# Patient Record
Sex: Female | Born: 1965
Health system: Southern US, Community
[De-identification: ages and names within clinical notes are randomized; demographics above are authoritative.]

## PROBLEM LIST (undated history)

## (undated) DIAGNOSIS — D219 Benign neoplasm of connective and other soft tissue, unspecified: Secondary | ICD-10-CM

## (undated) DIAGNOSIS — D649 Anemia, unspecified: Secondary | ICD-10-CM

## (undated) HISTORY — PX: OTHER SURGICAL HISTORY: SHX169

## (undated) HISTORY — PX: TUBAL LIGATION: SHX77

---

## 1998-02-02 ENCOUNTER — Other Ambulatory Visit: Admission: RE | Admit: 1998-02-02 | Discharge: 1998-02-02 | Payer: Self-pay | Admitting: Obstetrics & Gynecology

## 1998-02-02 ENCOUNTER — Other Ambulatory Visit: Admission: RE | Admit: 1998-02-02 | Discharge: 1998-02-02 | Payer: Self-pay | Admitting: Obstetrics and Gynecology

## 1998-03-14 ENCOUNTER — Encounter: Admission: RE | Admit: 1998-03-14 | Discharge: 1998-03-14 | Payer: Self-pay | Admitting: Infectious Diseases

## 1999-04-29 ENCOUNTER — Other Ambulatory Visit: Admission: RE | Admit: 1999-04-29 | Discharge: 1999-04-29 | Payer: Self-pay | Admitting: Obstetrics and Gynecology

## 1999-05-31 ENCOUNTER — Other Ambulatory Visit: Admission: RE | Admit: 1999-05-31 | Discharge: 1999-05-31 | Payer: Self-pay | Admitting: Obstetrics and Gynecology

## 1999-05-31 ENCOUNTER — Encounter (INDEPENDENT_AMBULATORY_CARE_PROVIDER_SITE_OTHER): Payer: Self-pay | Admitting: Specialist

## 2000-01-20 ENCOUNTER — Other Ambulatory Visit: Admission: RE | Admit: 2000-01-20 | Discharge: 2000-01-20 | Payer: Self-pay | Admitting: Obstetrics and Gynecology

## 2000-04-12 ENCOUNTER — Emergency Department (HOSPITAL_COMMUNITY): Admission: EM | Admit: 2000-04-12 | Discharge: 2000-04-12 | Payer: Self-pay | Admitting: Emergency Medicine

## 2000-04-13 ENCOUNTER — Emergency Department (HOSPITAL_COMMUNITY): Admission: EM | Admit: 2000-04-13 | Discharge: 2000-04-13 | Payer: Self-pay | Admitting: Emergency Medicine

## 2000-06-03 ENCOUNTER — Other Ambulatory Visit: Admission: RE | Admit: 2000-06-03 | Discharge: 2000-06-03 | Payer: Self-pay | Admitting: Obstetrics and Gynecology

## 2002-03-09 ENCOUNTER — Other Ambulatory Visit: Admission: RE | Admit: 2002-03-09 | Discharge: 2002-03-09 | Payer: Self-pay | Admitting: Obstetrics and Gynecology

## 2002-10-27 HISTORY — PX: MYOMECTOMY: SHX85

## 2003-04-28 ENCOUNTER — Other Ambulatory Visit: Admission: RE | Admit: 2003-04-28 | Discharge: 2003-04-28 | Payer: Self-pay | Admitting: Obstetrics and Gynecology

## 2003-09-13 ENCOUNTER — Inpatient Hospital Stay (HOSPITAL_COMMUNITY): Admission: RE | Admit: 2003-09-13 | Discharge: 2003-09-16 | Payer: Self-pay | Admitting: Obstetrics and Gynecology

## 2003-09-13 ENCOUNTER — Encounter (INDEPENDENT_AMBULATORY_CARE_PROVIDER_SITE_OTHER): Payer: Self-pay

## 2004-05-06 ENCOUNTER — Other Ambulatory Visit: Admission: RE | Admit: 2004-05-06 | Discharge: 2004-05-06 | Payer: Self-pay | Admitting: Family Medicine

## 2005-07-14 ENCOUNTER — Emergency Department (HOSPITAL_COMMUNITY): Admission: EM | Admit: 2005-07-14 | Discharge: 2005-07-14 | Payer: Self-pay | Admitting: Emergency Medicine

## 2005-08-05 ENCOUNTER — Other Ambulatory Visit: Admission: RE | Admit: 2005-08-05 | Discharge: 2005-08-05 | Payer: Self-pay | Admitting: Family Medicine

## 2005-12-17 ENCOUNTER — Other Ambulatory Visit: Admission: RE | Admit: 2005-12-17 | Discharge: 2005-12-17 | Payer: Self-pay | Admitting: Family Medicine

## 2010-04-26 ENCOUNTER — Other Ambulatory Visit: Admission: RE | Admit: 2010-04-26 | Discharge: 2010-04-26 | Payer: Self-pay | Admitting: Family Medicine

## 2010-08-13 ENCOUNTER — Encounter: Admission: RE | Admit: 2010-08-13 | Discharge: 2010-08-13 | Payer: Self-pay | Admitting: Family Medicine

## 2011-03-14 NOTE — H&P (Signed)
NAME:  Rebecca Lindsey, Rebecca Lindsey                         ACCOUNT NO.:  [id number]   MEDICAL RECORD NO.:  [id number]                   PATIENT TYPE:  INP   LOCATION:  NA                                   FACILITY:  [facility]   PHYSICIAN:  Maxie Better, M.D.            DATE OF BIRTH:  1966-07-29   DATE OF ADMISSION:  DATE OF DISCHARGE:                                HISTORY & PHYSICAL   ANTICIPATED DATE OF SURGERY:  September 13, 2003.   CHIEF COMPLAINT:  Heavy menses and uterine fibroids.   HISTORY OF PRESENT ILLNESS:  [age]-year-old gravida 4, para 2, 1,  1, 4 black female with a cesarean section times two with symptomatic uterine  fibroids admitted for myomectomy.  The patient has had heavy menses for the  first to days needing to change super plus tampons and pads every 1.5-2  hours, which are usually soaked.  She also has associated clots.  Her last a  little bit under seven days.  The patient has known history of uterine  fibroids.  She has a known history of blood transfusion in 1986 and she has  had a tubal ligation.  Her evaluation included an ultrasound on May 16, 2003 showing multiple uterine fibroids.  The uterus measured 11.7 x 9.4 x  8.5 cm with a 3.1 cm endometrial mass suggestive of a submucosal fibroids.  Both ovaries were normal at that time.   The patient underwent counseling regarding her options and in spite of her  tubal ligation she desires to preserve her uterus.   A CBC drawn on May 16, 2003 showed a hemoglobin of 10.2 and the patient was  started on iron supplementation.   ALLERGIES:  No known drug allergies.   MEDICATIONS:  Niferex.   PAST MEDICAL HISTORY:  1. Irritable bowel syndrome.  2. Lymphocytic T4 on blood count.  3. Status post history of blood transfusion in 1986.   PAST GYNECOLOGICAL HISTORY:  1. CIN-28 May 1999.  2. Uterus fibroids.   PAST SURGICAL HISTORY:  1. Cesarean section times two.  2. Tubal ligation.  3. D&C.   PAST  OBSTETRICAL HISTORY:  1. D&E times one.  2. Two term deliveries.  3. One preterm birth of twins.   FAMILY HISTORY:  Family history is noncontributory.   SOCIAL HISTORY:  Four children.  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   REVIEW OF SYSTEMS:  Review of systems negative, except for notice of the GI  system; history of irritable bowel.  GYN history as per the history of  present illness.  Gingival bleeding. All other systems are negative.   PHYSICAL EXAMINATION:  GENERAL APPEARANCE:  Well-developed, well-nourished  black female in no acute distress.  VITAL SIGNS:  Blood pressure 100/62.  Weight 146 pounds.  Height is 5 feet 4  inches.  SKIN:  Skin shows no lesions.  HEENT EXAMINATION:  Anicteric sclerae.  Pale  pink conjunctivae.  Oropharynx  negative.  HEART:  Heart is regular rate and rhythm without murmur.  LUNGS:  Lungs are clear to auscultation.  BREASTS:  Breasts are soft, nontender and not palpable mass.  LYMPHATICS:  Nodes have no palpable axillary, supraclavicular or inguinal  nodes.  BACK:  No CVA tenderness.  No paraspinal tenderness.  ABDOMEN:  Abdomen soft.  Vertical scar.  Palpable suprapubic mass.  No  organomegaly of the liver or spleen.  PELVIC EXAMINATION:  Vulva shows no lesion.  Vagina has no discharge.  Cervix is clear.  Uterus is irregular, anteverted, 10 weeks size.  Adnexa;  no palpable mass and nontender.   IMPRESSION:  Symptomatic uterine fibroids.   PLAN:  1. Admission.  2. Myomectomy.  3. Antibiotic prophylaxis.  4. Antiembolic stockings.   The risks of the procedure including, but not limited to infection,  bleeding, up to a 30% chance of needing surgery in the future such as a  myomectomy or a hysterectomy, injury to surrounding organs or structures  such as the bladder, bowel or ureters, internal scar tissue, infertility  secondary to scar related to the myomectomy, about a 2% risk of a  hysterectomy, and discussed if fibroid  in location which increases he risks  of a hysterectomy the fibroid will be left alone.  The risks of blood  transfusion were discussed including, but not limited to HIV transmission,  hepatitis transmission and acute reaction.  Postop care and criteria for  discharge were reviewed.  Routine admission labs planned and ordered  antiembolic stockings.  Continuation of iron supplementation  postoperatively.                                               Maxie Better, M.D.    [place]/MEDQ  D:  09/13/2003  T:  09/13/2003  Job:  [id number]

## 2011-03-14 NOTE — Op Note (Signed)
NAME:  Rebecca Lindsey, Rebecca Lindsey                         ACCOUNT NO.:  [id number]   MEDICAL RECORD NO.:  [id number]                   PATIENT TYPE:  INP   LOCATION:  9399                                 FACILITY:  [facility]   PHYSICIAN:  Maxie Better, M.D.            DATE OF BIRTH:  1966/09/04   DATE OF PROCEDURE:  09/13/2003  DATE OF DISCHARGE:                                 OPERATIVE REPORT   PREOPERATIVE DIAGNOSIS:  Symptomatic uterine fibroids.   PROCEDURES:  1. Exploratory laparotomy.  2. Multiple myomectomy.   POSTOPERATIVE DIAGNOSES:  1. Intramural, intracavitary, subserosal pedunculated fibroids.  2. Menorrhagia.   ANESTHESIA:  General.   SURGEON:  Maxie Better, M.D.   ASSISTANT:  Richardean Sale, M.D.   INDICATIONS:  This is a 45 year old gravida 4, para 2-1-1-4, female, with a  history of a cesarean section and tubal ligation, who has had symptomatic  uterine fibroids in the form of menorrhagia with associated anemia, who now  presents for surgical management.  The patient has a planned hypertension.  The risks and benefits of the procedure have been explained to the patient.  Consent was signed.  The patient was transferred to the operating room.   DESCRIPTION OF PROCEDURE:  Under adequate general anesthesia, the patient  was placed in the supine position.  Examination under anesthesia revealed a  10 week-size irregular, mobile uterus.  Antibiotic prophylaxis was started,  as were intermittent compression stockings.  The patient was sterilely  prepped and draped in the usual fashion.  A pediatric Foley was utilized for  indwelling Foley catheter.  Marcaine 0.25% 8 mL was injected along the  previous vertical skin incision.  A vertical skin incision was then made,  carried down to the rectus fascia using Bovie cautery.  The rectus fascia  was incised and extended superiorly and inferiorly.  The rectus muscle was  then split in the midline and the parietal peritoneum  was entered bluntly.  On entering, the upper abdomen was explored.  Normal palpable liver edge and  kidneys.  Attention was then turned to the uterus, which was about 10 weeks'  size with multiple fibroids, some of which were pedunculated in the anterior  fundal aspect as well as posteriorly.  Prominently was a left low uterine  segment up to the left round ligament of a 5 cm lateral fibroid, a 3 cm  right lower uterine segment lateral fibroid, and another larger right fundal  posterior fibroid just below the right fallopian tube.  Both tubes were  notable for previous surgical separation.  The proximal portion of the left  fallopian tube was 1 cm.  The proximal portion of the right fallopian tube  was 2 cm in length.  The distal portion of the tube otherwise looked normal.  Both ovaries were normal.  There were several intramural fibroids on  palpation.  The bowels were packed upwardly and a self-retaining retractor  was then  utilized and using one of the pedunculated fibroids, a single-tooth  tenaculum was used to place traction on the uterus.  A dilute solution of  Pitressin was injected anteriorly overlying the fibroid.  A vertical  incision was then made. The fibroid was enucleated through the base.  Two  other additional fibroids were removed and on removal of those additional  fibroids, the endometrial cavity was entered.  The opportunity was then  taken to explore the cavity with a digital finger, which confirmed the  finding of a left intracavitary fibroid, which was then removed.  The  incision was then closed with a deep layer of 0 Vicryl running, interrupted  figure-of-eight sutures, and its surface was approximated using 2-0 Monocryl  suture.  Attention was then turned to the right posterior fundal fibroid and  Pitressin was then injected.  A transverse incision was then made to avoid  adhesions that potentially could occur in that area for the tube and the  fibroid was  enucleated from its base.  Additional fibroid was noted;  however, it was unclear as to whether or not this was early fibroid  formation versus adenomyosis, and a portion of that was removed and sent for  evaluation.  The defect was then explored further.  Additional fibroids were  removed.  The cavity was then again closed in this usual fashion with 0  Vicryl figure-of-eight sutures and the serosal surface was closed with 2-0  Vicryl baseball stitch.  The right lower uterine segment fibroid after much  examination was felt to be manageable to be removed, at which time Pitressin  was injected, an incision was then made medially, and the fibroid was then  removed, the base closed with 0 Vicryl in continuous fashion.  Good  hemostasis was noted.  Considerable contemplation regarding removal of the  left fibroid was done and after its visualization posteriorly and anteriorly  and palpation a decision was then made to remove the fibroid anteriorly, and  this was performed with a transverse incision made and the fibroid removed  with traction toward the midline, taking care not to disturb the lateral  vessels.  This was accomplished.  The lateral aspect of the broad ligament  was inspected, no bleeders were noted, and careful closure of the defect was  then accomplished using 0 Vicryl figure-of-eight sutures up to the level of  the serosal surface.  At that point good hemostasis was noted and  subsequently there was increased bleeding noted that was consistent with  venous bleeding.  The venous area was gently clamped and a 3-0 Vicryl suture  was then used to base the vessel towards the myometrium, and this resulted  in hemostasis noted.  Posteriorly the broad ligament was inspected for any  evidence of hematoma.  This was not seen.  The bladder peritoneum that was  noted anteriorly and to be adherent was partially lysed in order to facilitate the removal of these fibroids.  The pedunculated and  remaining  fibroids were then removed in a similar fashion, exposed with either 0  Vicryl figure-of-eight and 2-0 Monocryl or just with cauterization due to  their small size.  With good hemostasis noted after monitoring the incision  sites, the abdomen was irrigated, suctioned of debris.  There was bleeding  noted in the first incision vertically of the uterus, and his was reinforced  using 2-0 Monocryl sutures interrupted.  Interceed was then placed over  anterior and posterior incisions.  The upper abdomen packings were  then  removed.  The self-retaining retractor and B[place]our was removed.  The omental  adhesion that was noted to the left anterior wall was lysed.  The appendix  was not seen.  The parietal peritoneum was not closed.  The rectus fascia  was closed with 0 Vicryl x2 and the skin was approximated using Ethicon  staples after irrigation and suction and small bleeders cauterized.   SPECIMENS:  Myoma x11 and tissue for evaluation of adenomyosis.   ESTIMATED BLOOD LOSS:  400 mL.   FLUIDS REPLACED:  1800 mL crystalloid.   URINE OUTPUT:  160 mL clear yellow urine.   COUNTS:  Sponge and instrument counts were correct.   COMPLICATIONS:  None.  The endometrial cavity was entered.   DISPOSITION:  The patient tolerated the procedure well, was transferred to  the recovery room in stable condition.                                               Maxie Better, M.D.    [place]/MEDQ  D:  09/13/2003  T:  09/14/2003  Job:  [id number]

## 2011-03-14 NOTE — Discharge Summary (Signed)
NAME:  Rebecca Lindsey, Rebecca Lindsey                         ACCOUNT NO.:  [id number]   MEDICAL RECORD NO.:  [id number]                   PATIENT TYPE:  INP   LOCATION:  9309                                 FACILITY:  [facility]   PHYSICIAN:  Maxie Better, M.D.            DATE OF BIRTH:  January 26, 1966   DATE OF ADMISSION:  09/13/2003  DATE OF DISCHARGE:  09/16/2003                                 DISCHARGE SUMMARY   ADMISSION DIAGNOSIS:  Symptomatic uterine fibroids.   DISCHARGE DIAGNOSES:  1. Adenomyosis.  2. Leiomyomata.  3. Endometrial polyp.   PROCEDURE:  1. Exploratory laparotomy.  2. Multiple myomectomy.   HISTORY OF PRESENT ILLNESS:  A 45 year old gravida 4, para 2-1-1-4 black  female with cesarean section x2 with symptomatic uterine fibroids admitted  for myomectomy.  Please see the dictated H&P for specific details.   HOSPITAL COURSE:  The patient was admitted to [facility]  She was  taken to the operating room where she underwent exploratory laparotomy with  multiple myomectomy.  Findings at the time of surgery was a 10-week sized  irregular uterus, bladder/peritoneum adhesions.  Endometrial cavity was  entered.  Intracavitary 2.5 cm lesion was removed.  Additional fibroids were  removed as noted in operative report.  The appendix was not seen.  She had a  normal upper abdomen.  The omentum had some adhesions to the left anterior  wall, which were lysed.  The postoperative course was complicated by slow  bowel function.  Her CBC on postoperative day #1 showed a hemoglobin 9.8,  hematocrit 29.1, white count 12.3.  The patient was placed on a regular  diet, however, was slow to pass flatus.  She developed what appeared to be  probably a postoperative ileus.  She was tried with warm heat to the  abdomen, repeat Dulcolax suppositories and subsequently on postoperative day  #3 the patient was passing flatus, had some nausea with her pain medicine,  but was complaining of hunger.   Tolerated a regular diet and was ready to go  home.  Temperature maximum was 100.4.  She subsequently defervesced after 24  hours.  Her incision, which was vertical, had staples.  No erythema,  induration, or exudate.  Had no blood.  Abdomen with active bowel sounds,  soft, with markedly decreased distention.  She was deemed well to be  discharged home.   DISPOSITION:  Home.   CONDITION ON DISCHARGE:  Stable.   DISCHARGE INSTRUCTIONS:  Call for temperature greater than or equal to  100.4.  Nothing per vagina for four to six weeks.  No heavy lifting or  driving for two weeks.  Call for severe abdominal pain, nausea, vomiting,  soaking a regular pad every hour or more frequently.   DISCHARGE MEDICATIONS:  1. Zofran ODT.  2. Motrin 800 mg one p.o. q.6-8h. p.r.n. pain.  3. Tylenol No.3 one to two tablets q.3-4h. p.r.n. pain.   DISCHARGE FOLLOWUP:  Staple removal on November 24.  Follow up at four to  six weeks thereafter.   FINAL PATHOLOGY:  Benign endometrial polyp, 11 benign leiomyomata, and  adenomyosis.                                               Maxie Better, M.D.    [place]/MEDQ  D:  10/19/2003  T:  10/19/2003  Job:  [id number]

## 2011-07-14 ENCOUNTER — Other Ambulatory Visit: Payer: Self-pay | Admitting: Family Medicine

## 2011-07-14 DIAGNOSIS — Z1231 Encounter for screening mammogram for malignant neoplasm of breast: Secondary | ICD-10-CM

## 2012-09-06 ENCOUNTER — Other Ambulatory Visit: Payer: Self-pay | Admitting: Family Medicine

## 2012-09-06 DIAGNOSIS — Z1231 Encounter for screening mammogram for malignant neoplasm of breast: Secondary | ICD-10-CM

## 2012-10-14 ENCOUNTER — Ambulatory Visit
Admission: RE | Admit: 2012-10-14 | Discharge: 2012-10-14 | Disposition: A | Discharge status: Home / Self Care | Payer: 59 | Source: Ambulatory Visit | Attending: Family Medicine | Admitting: Family Medicine

## 2012-10-14 DIAGNOSIS — Z1231 Encounter for screening mammogram for malignant neoplasm of breast: Secondary | ICD-10-CM

## 2013-12-20 ENCOUNTER — Other Ambulatory Visit: Payer: Self-pay

## 2013-12-20 DIAGNOSIS — Z1231 Encounter for screening mammogram for malignant neoplasm of breast: Secondary | ICD-10-CM

## 2014-01-02 ENCOUNTER — Ambulatory Visit
Admission: RE | Admit: 2014-01-02 | Discharge: 2014-01-02 | Disposition: A | Discharge status: Home / Self Care | Payer: 59 | Source: Ambulatory Visit

## 2014-01-02 DIAGNOSIS — Z1231 Encounter for screening mammogram for malignant neoplasm of breast: Secondary | ICD-10-CM

## 2015-05-14 ENCOUNTER — Other Ambulatory Visit: Payer: Self-pay | Admitting: Family Medicine

## 2015-05-17 LAB — CYTOLOGY - PAP

## 2015-07-13 ENCOUNTER — Other Ambulatory Visit: Payer: Self-pay | Admitting: Obstetrics and Gynecology

## 2015-07-13 DIAGNOSIS — D259 Leiomyoma of uterus, unspecified: Secondary | ICD-10-CM

## 2015-07-13 DIAGNOSIS — N921 Excessive and frequent menstruation with irregular cycle: Secondary | ICD-10-CM

## 2015-07-26 ENCOUNTER — Ambulatory Visit
Admission: RE | Admit: 2015-07-26 | Discharge: 2015-07-26 | Disposition: A | Discharge status: Home / Self Care | Payer: 59 | Source: Ambulatory Visit | Attending: Obstetrics and Gynecology | Admitting: Obstetrics and Gynecology

## 2015-07-26 ENCOUNTER — Other Ambulatory Visit: Payer: Self-pay | Admitting: Obstetrics and Gynecology

## 2015-07-26 ENCOUNTER — Encounter (INDEPENDENT_AMBULATORY_CARE_PROVIDER_SITE_OTHER): Payer: Self-pay

## 2015-07-26 DIAGNOSIS — N921 Excessive and frequent menstruation with irregular cycle: Secondary | ICD-10-CM | POA: Insufficient documentation

## 2015-07-26 DIAGNOSIS — D259 Leiomyoma of uterus, unspecified: Secondary | ICD-10-CM | POA: Insufficient documentation

## 2015-07-26 HISTORY — DX: Benign neoplasm of connective and other soft tissue, unspecified: D21.9

## 2015-07-26 HISTORY — DX: Anemia, unspecified: D64.9

## 2015-07-26 NOTE — Consult Note (Signed)
Chief Complaint: Symptomatic Uterine Fibroids.   Referring Physician(s): Dr. Servando Salina  History of Present Illness: Rebecca Lindsey is a 49 y.o. female presenting today for evaluation of symptomatic uterine fibroids, kindly referred by Dr. Garwin Brothers.    Rebecca Lindsey reports that she has symptoms in all 3 classes of symptoms attributable to uterine fibroids, including heavy bleeding, pain, and bulk-type symptoms. She reports that although she had a 2 or 3 month. At the beginning of 2016, she has had regularly heavy bleeding during her cycles since May, including some of her periods as long as 14 days, so with a range from 5 days to 14 days. She typically has at least 3-4 days of heavy bleeding with frequent tampon and pad changes, with large clots. In addition she describes a cramping pain within her low pelvis which is uncomfortable, as well as urinary frequency and urgency and incomplete emptying of her bladder.  In fact, she has had a prior myomectomy she reports in 2004 to treat her fibroids, now with recurrence of symptoms.  She has no intention for further pregnancy.  I had a lengthy discussion regarding the pathology, anatomy, pathophysiology, as well as treatment options for uterine fibroids. Treatment options which I discussed included those with which she is familiar including myomectomy, hysterectomy, as well as high intensity focused ultrasound, also with which she was familiar. In addition I discussed an option of medical therapy, which she has already discussed with her OB/GYN.  I had a lengthy discussion regarding the endovascular option of uterine fibroid embolization, including risks and benefits. Specific risks include infection, arterial injury, contrast reaction, kidney injury, failure of treatment, need for further surgery/procedure, need for hysterectomy, endometritis/abscess, cardiopulmonary collapse, death. I also discussed with her the efficacy of the procedure  with our knowledge over the past 20 years of at least 92% to 95% efficacy of the procedure at 12 months.  I offered her a superior hypogastric nerve block as an adjunctive pain treatment for the procedure.      Past Medical History  Diagnosis Date  . Fibroid   . Anemia     Past Surgical History  Procedure Laterality Date  . Cesarean section    . Tubal ligation    . Myomectomy  2004    Allergies: Review of patient's allergies indicates no known allergies.  Medications: Prior to Admission medications   Medication Sig Start Date End Date Taking? Authorizing Provider  iron polysaccharides (NU-IRON) 150 MG capsule Take 150 mg by mouth 2 (two) times daily.   Yes Historical Provider, MD     No family history on file.  Social History   Social History  . Marital Status: Single    Spouse Name: N/A  . Number of Children: N/A  . Years of Education: N/A   Social History Main Topics  . Smoking status: Never Smoker   . Smokeless tobacco: Never Used  . Alcohol Use: 0.0 oz/week    0 Standard drinks or equivalent per week     Comment: occasional glass of wine    . Drug Use: No  . Sexual Activity: Not on file   Other Topics Concern  . Not on file   Social History Narrative  . No narrative on file     Review of Systems: A 12 point ROS discussed and pertinent positives are indicated in the HPI above.  All other systems are negative.  Review of Systems  Vital Signs: BP 108/69 mmHg  Pulse 66  Temp(Src) 99 F (37.2 C) (Oral)  Resp 14  Ht 5\' 4"  (1.626 m)  Wt 163 lb (73.936 kg)  BMI 27.97 kg/m2  SpO2 100%  LMP 06/26/2015 (Approximate)  Physical Exam Atraumatic normocephalic, mucous membranes moist pink. No icterus or scleral injection. Conjugate gaze. Neck soft supple no adenopathy. Cervical range of motion intact. Moving all 4 extremities equally and grossly. No gross motor or sensory deficits. Answering questions appropriately with normal affect. Alerted to  person place and time. Clear to auscultation bilaterally. No wheezes rhonchi or rales. Regular rate and rhythm. No third heart sound appreciated. Abdomen soft nontender. Negative Murphy sign. No enlargement of the liver or spleen is appreciated. Palpable distal pulses. Palpable common femoral pulse on the right. No lower extremity swelling.    Mallampati Score: 2    Imaging: No results found.  Labs:  CBC: No results for input(s): WBC, HGB, HCT, PLT in the last 8760 hours.  COAGS: No results for input(s): INR, APTT in the last 8760 hours.  BMP: No results for input(s): NA, K, CL, CO2, GLUCOSE, BUN, CALCIUM, CREATININE, GFRNONAA, GFRAA in the last 8760 hours.  Invalid input(s): CMP  LIVER FUNCTION TESTS: No results for input(s): BILITOT, AST, ALT, ALKPHOS, PROT, ALBUMIN in the last 8760 hours.  TUMOR MARKERS: No results for input(s): AFPTM, CEA, CA199, CHROMGRNA in the last 8760 hours.  Assessment and Plan:  Mrs. Ace Lindsey is a 49 year old female presenting for evaluation for uterine artery embolization for symptomatic uterine fibroids, of which she is demonstrating all 3 class symptoms of her fibroids.  I believe she is a candidate for uterine fibroid embolization, and after our discussion, she wishes to proceed. At this time she is deferring a superior hypogastric nerve block.  We will order an MRI for evaluation, with a plan on future treatment.  Thank you for this interesting consult.  I greatly enjoyed meeting [name] and look forward to participating in their care.  A copy of this report was sent to the requesting provider on this date.  SignedCorrie Mckusick 07/26/2015, 2:31 PM   I spent a total of  40 Minutes   in face to face in clinical consultation, greater than 50% of which was counseling/coordinating care for symptomatic uterine fibroids, and UFE.

## 2015-08-09 ENCOUNTER — Ambulatory Visit
Admission: RE | Admit: 2015-08-09 | Discharge: 2015-08-09 | Disposition: A | Discharge status: Home / Self Care | Payer: 59 | Source: Ambulatory Visit | Attending: Obstetrics and Gynecology | Admitting: Obstetrics and Gynecology

## 2015-08-09 DIAGNOSIS — N921 Excessive and frequent menstruation with irregular cycle: Secondary | ICD-10-CM

## 2015-08-09 DIAGNOSIS — D259 Leiomyoma of uterus, unspecified: Secondary | ICD-10-CM

## 2015-08-09 MED ORDER — GADOBENATE DIMEGLUMINE 529 MG/ML IV SOLN: 14.0000 mL | Freq: Once | INTRAVENOUS | Status: DC | PRN

## 2015-08-14 ENCOUNTER — Other Ambulatory Visit: Payer: Self-pay | Admitting: Interventional Radiology

## 2015-08-14 DIAGNOSIS — D251 Intramural leiomyoma of uterus: Secondary | ICD-10-CM

## 2015-08-17 ENCOUNTER — Other Ambulatory Visit: Payer: Self-pay | Admitting: Radiology

## 2015-08-21 ENCOUNTER — Encounter (HOSPITAL_COMMUNITY): Payer: Self-pay

## 2015-08-21 ENCOUNTER — Ambulatory Visit (HOSPITAL_COMMUNITY)
Admission: RE | Admit: 2015-08-21 | Discharge: 2015-08-21 | Disposition: A | Discharge status: Home / Self Care | Payer: 59 | Source: Ambulatory Visit | Attending: Interventional Radiology | Admitting: Interventional Radiology

## 2015-08-21 ENCOUNTER — Other Ambulatory Visit: Payer: Self-pay | Admitting: Interventional Radiology

## 2015-08-21 ENCOUNTER — Observation Stay (HOSPITAL_COMMUNITY)
Admission: RE | Admit: 2015-08-21 | Discharge: 2015-08-22 | Disposition: A | Discharge status: Home / Self Care | Payer: 59 | Source: Ambulatory Visit | Attending: Interventional Radiology | Admitting: Interventional Radiology

## 2015-08-21 DIAGNOSIS — Z79899 Other long term (current) drug therapy: Secondary | ICD-10-CM | POA: Diagnosis not present

## 2015-08-21 DIAGNOSIS — D649 Anemia, unspecified: Secondary | ICD-10-CM | POA: Diagnosis not present

## 2015-08-21 DIAGNOSIS — D251 Intramural leiomyoma of uterus: Principal | ICD-10-CM | POA: Diagnosis present

## 2015-08-21 LAB — BASIC METABOLIC PANEL
Anion gap: 5 (ref 5–15)
BUN: 12 mg/dL (ref 6–20)
CALCIUM: 9.1 mg/dL (ref 8.9–10.3)
CO2: 25 mmol/L (ref 22–32)
CREATININE: 0.75 mg/dL (ref 0.44–1.00)
Chloride: 109 mmol/L (ref 101–111)
GFR calc Af Amer: >60 mL/min (ref >60)
Glucose, Bld: 92 mg/dL (ref 65–99)
POTASSIUM: 3.9 mmol/L (ref 3.5–5.1)
SODIUM: 139 mmol/L (ref 135–145)

## 2015-08-21 LAB — CBC WITH DIFFERENTIAL/PLATELET
BASOS ABS: 0 10*3/uL (ref 0.0–0.1)
BASOS PCT: 1 %
EOS ABS: 0.1 10*3/uL (ref 0.0–0.7)
EOS PCT: 2 %
HCT: 33 % — ABNORMAL LOW (ref 36.0–46.0)
Hemoglobin: 10.6 g/dL — ABNORMAL LOW (ref 12.0–15.0)
LYMPHS PCT: 44 %
Lymphs Abs: 1.7 10*3/uL (ref 0.7–4.0)
MCH: 26.7 pg (ref 26.0–34.0)
MCHC: 32.1 g/dL (ref 30.0–36.0)
MCV: 83.1 fL (ref 78.0–100.0)
Monocytes Absolute: 0.4 10*3/uL (ref 0.1–1.0)
Monocytes Relative: 11 %
Neutro Abs: 1.6 10*3/uL — ABNORMAL LOW (ref 1.7–7.7)
Neutrophils Relative %: 42 %
PLATELETS: 288 10*3/uL (ref 150–400)
RBC: 3.97 MIL/uL (ref 3.87–5.11)
RDW: 20.6 % — AB (ref 11.5–15.5)
WBC: 3.9 10*3/uL — AB (ref 4.0–10.5)

## 2015-08-21 LAB — HCG, SERUM, QUALITATIVE: Preg, Serum: NEGATIVE

## 2015-08-21 LAB — PROTIME-INR
INR: 0.99 (ref 0.00–1.49)
PROTHROMBIN TIME: 13.3 s (ref 11.6–15.2)

## 2015-08-21 LAB — APTT: APTT: 28 s (ref 24–37)

## 2015-08-21 MED ORDER — SODIUM CHLORIDE 0.9 % IJ SOLN: 3.0000 mL | Freq: Two times a day (BID) | INTRAMUSCULAR | Status: DC

## 2015-08-21 MED ORDER — DEXAMETHASONE SODIUM PHOSPHATE 4 MG/ML IJ SOLN
4.0000 mg | Freq: Once | INTRAMUSCULAR | Status: AC
  Administered 2015-08-21: 4 mg via INTRAVENOUS
  Filled 2015-08-21 (×4): qty 1

## 2015-08-21 MED ORDER — KETOROLAC TROMETHAMINE 30 MG/ML IJ SOLN
30.0000 mg | Freq: Four times a day (QID) | INTRAMUSCULAR | Status: DC
  Administered 2015-08-21 – 2015-08-22 (×2): 30 mg via INTRAVENOUS
  Filled 2015-08-21 (×7): qty 1

## 2015-08-21 MED ORDER — NALOXONE HCL 0.4 MG/ML IJ SOLN: 0.4000 mg | INTRAMUSCULAR | Status: DC | PRN

## 2015-08-21 MED ORDER — DIPHENHYDRAMINE HCL 50 MG/ML IJ SOLN: 12.5000 mg | Freq: Four times a day (QID) | INTRAMUSCULAR | Status: DC | PRN

## 2015-08-21 MED ORDER — MIDAZOLAM HCL 2 MG/2ML IJ SOLN
INTRAMUSCULAR | Status: AC
  Filled 2015-08-21: qty 6

## 2015-08-21 MED ORDER — HYDROMORPHONE 1 MG/ML IV SOLN
INTRAVENOUS | Status: DC
  Administered 2015-08-21: 16:00:00 via INTRAVENOUS

## 2015-08-21 MED ORDER — PROMETHAZINE HCL 25 MG RE SUPP
25.0000 mg | Freq: Three times a day (TID) | RECTAL | Status: DC | PRN
  Administered 2015-08-21: 25 mg via RECTAL
  Filled 2015-08-21: qty 1

## 2015-08-21 MED ORDER — PROCHLORPERAZINE EDISYLATE 5 MG/ML IJ SOLN: 10.0000 mg | Freq: Four times a day (QID) | INTRAMUSCULAR | Status: DC | PRN

## 2015-08-21 MED ORDER — ONDANSETRON HCL 4 MG/2ML IJ SOLN: 4.0000 mg | Freq: Four times a day (QID) | INTRAMUSCULAR | Status: DC | PRN

## 2015-08-21 MED ORDER — KETOROLAC TROMETHAMINE 30 MG/ML IJ SOLN: 30.0000 mg | Freq: Once | INTRAMUSCULAR | Status: DC

## 2015-08-21 MED ORDER — CEFAZOLIN SODIUM-DEXTROSE 2-3 GM-% IV SOLR
2.0000 g | INTRAVENOUS | Status: AC
  Administered 2015-08-21: 2 g via INTRAVENOUS

## 2015-08-21 MED ORDER — FENTANYL CITRATE (PF) 100 MCG/2ML IJ SOLN
INTRAMUSCULAR | Status: AC | PRN
  Administered 2015-08-21: 25 ug via INTRAVENOUS
  Administered 2015-08-21: 50 ug via INTRAVENOUS
  Administered 2015-08-21: 25 ug via INTRAVENOUS

## 2015-08-21 MED ORDER — MIDAZOLAM HCL 2 MG/2ML IJ SOLN
INTRAMUSCULAR | Status: AC | PRN
  Administered 2015-08-21: 0.5 mg via INTRAVENOUS
  Administered 2015-08-21: 1 mg via INTRAVENOUS
  Administered 2015-08-21 (×2): 0.5 mg via INTRAVENOUS

## 2015-08-21 MED ORDER — SODIUM CHLORIDE 0.9 % IV SOLN: 250.0000 mL | INTRAVENOUS | Status: DC | PRN

## 2015-08-21 MED ORDER — FENTANYL CITRATE (PF) 100 MCG/2ML IJ SOLN
INTRAMUSCULAR | Status: AC
  Filled 2015-08-21: qty 4

## 2015-08-21 MED ORDER — SODIUM CHLORIDE 0.9 % IV SOLN
Freq: Once | INTRAVENOUS | Status: AC
  Administered 2015-08-21: 10:00:00 via INTRAVENOUS

## 2015-08-21 MED ORDER — DOCUSATE SODIUM 100 MG PO CAPS
100.0000 mg | ORAL_CAPSULE | Freq: Two times a day (BID) | ORAL | Status: DC
Start: 2015-08-21 — End: 2015-08-22
  Administered 2015-08-22: 100 mg via ORAL
  Filled 2015-08-21 (×2): qty 1

## 2015-08-21 MED ORDER — PROMETHAZINE HCL 25 MG PO TABS: 25.0000 mg | ORAL_TABLET | Freq: Three times a day (TID) | ORAL | Status: DC | PRN

## 2015-08-21 MED ORDER — ONDANSETRON HCL 4 MG/2ML IJ SOLN
4.0000 mg | Freq: Four times a day (QID) | INTRAMUSCULAR | Status: DC
  Administered 2015-08-21 – 2015-08-22 (×3): 4 mg via INTRAVENOUS
  Filled 2015-08-21 (×2): qty 2

## 2015-08-21 MED ORDER — DIPHENHYDRAMINE HCL 12.5 MG/5ML PO ELIX: 12.5000 mg | ORAL_SOLUTION | Freq: Four times a day (QID) | ORAL | Status: DC | PRN

## 2015-08-21 MED ORDER — HYDROMORPHONE 1 MG/ML IV SOLN
INTRAVENOUS | Status: AC
  Filled 2015-08-21: qty 25

## 2015-08-21 MED ORDER — IOHEXOL 300 MG/ML  SOLN
180.0000 mL | Freq: Once | INTRAMUSCULAR | Status: DC | PRN
Start: 2015-08-21 — End: 2015-08-22
  Administered 2015-08-21: 180 mL via INTRA_ARTERIAL
  Filled 2015-08-21: qty 180

## 2015-08-21 MED ORDER — KETOROLAC TROMETHAMINE 60 MG/2ML IM SOLN
60.0000 mg | Freq: Once | INTRAMUSCULAR | Status: AC
  Administered 2015-08-21: 60 mg via INTRAMUSCULAR
  Filled 2015-08-21 (×2): qty 2

## 2015-08-21 MED ORDER — LIDOCAINE HCL 1 % IJ SOLN
INTRAMUSCULAR | Status: AC
  Filled 2015-08-21: qty 20

## 2015-08-21 MED ORDER — SODIUM CHLORIDE 0.9 % IJ SOLN
9.0000 mL | INTRAMUSCULAR | Status: DC | PRN
Start: 2015-08-21 — End: 2015-08-21

## 2015-08-21 MED ORDER — DIPHENHYDRAMINE HCL 12.5 MG/5ML PO ELIX
12.5000 mg | ORAL_SOLUTION | Freq: Four times a day (QID) | ORAL | Status: DC | PRN
  Filled 2015-08-21: qty 5

## 2015-08-21 MED ORDER — SODIUM CHLORIDE 0.9 % IJ SOLN: 3.0000 mL | INTRAMUSCULAR | Status: DC | PRN

## 2015-08-21 MED ORDER — SODIUM CHLORIDE 0.9 % IV SOLN
INTRAVENOUS | Status: AC
  Administered 2015-08-21 – 2015-08-22 (×2): via INTRAVENOUS

## 2015-08-21 MED ORDER — FENTANYL 40 MCG/ML IV SOLN
INTRAVENOUS | Status: DC
  Administered 2015-08-21: 19:00:00 via INTRAVENOUS
  Administered 2015-08-21: 45 ug via INTRAVENOUS
  Filled 2015-08-21: qty 25

## 2015-08-21 MED ORDER — ONDANSETRON HCL 4 MG/2ML IJ SOLN
4.0000 mg | Freq: Once | INTRAMUSCULAR | Status: AC
  Administered 2015-08-21: 4 mg via INTRAVENOUS
  Filled 2015-08-21: qty 2

## 2015-08-21 MED ORDER — CEFAZOLIN SODIUM-DEXTROSE 2-3 GM-% IV SOLR
INTRAVENOUS | Status: AC
  Filled 2015-08-21: qty 50

## 2015-08-21 MED ORDER — ONDANSETRON HCL 4 MG/2ML IJ SOLN
4.0000 mg | Freq: Four times a day (QID) | INTRAMUSCULAR | Status: DC | PRN
  Filled 2015-08-21: qty 2

## 2015-08-21 MED ORDER — SODIUM CHLORIDE 0.9 % IJ SOLN: 9.0000 mL | INTRAMUSCULAR | Status: DC | PRN

## 2015-08-21 NOTE — Plan of Care (Signed)
Problem: Phase I Progression Outcomes Goal: Voiding-avoid urinary catheter unless indicated Outcome: Not Applicable Date Met:  65/99/35 Foley cath to be DC'd at 6 am tomorrow

## 2015-08-21 NOTE — H&P (Signed)
Chief Complaint: Patient was seen in consultation today for symptomatic uterine fibroids at the request of Dr. Servando Salina  Referring Physician(s): Dr. Servando Salina  History of Present Illness: Rebecca Lindsey is a 49 y.o. female with history of symptomatic uterine fibroids who was seen in full IR consult on 07/26/15- see full consult. She was deemed a candidate for UFE and scheduled today to undergo the procedure. She denies any current abdominal/pelvic pain. She does admit to current vaginal spotting, denies any lightheadedness or dizziness. She denies any urinary symptoms, nausea or vomiting. She denies any chest pain, shortness of breath or palpitations. She denies any recent infections, fever or chills. The patient denies any history of sleep apnea or chronic oxygen use. She has previously tolerated sedation without complications.    Past Medical History  Diagnosis Date  . Fibroid   . Anemia     Past Surgical History  Procedure Laterality Date  . Cesarean section    . Tubal ligation    . Myomectomy  2004    Allergies: Review of patient's allergies indicates no known allergies.  Medications: Prior to Admission medications   Medication Sig Start Date End Date Taking? Authorizing Provider  iron polysaccharides (NU-IRON) 150 MG capsule Take 150 mg by mouth 2 (two) times daily.   Yes Historical Provider, MD  Multiple Vitamin (MULTIVITAMIN WITH MINERALS) TABS tablet Take 1 tablet by mouth daily.   Yes Historical Provider, MD     History reviewed. No pertinent family history.  Social History   Social History  . Marital Status: Single    Spouse Name: N/A  . Number of Children: N/A  . Years of Education: N/A   Social History Main Topics  . Smoking status: Never Smoker   . Smokeless tobacco: Never Used  . Alcohol Use: 0.0 oz/week    0 Standard drinks or equivalent per week     Comment: occasional glass of wine    . Drug Use: No  . Sexual Activity: Not  Asked   Other Topics Concern  . None   Social History Narrative   Review of Systems: A 12 point ROS discussed and pertinent positives are indicated in the HPI above.  All other systems are negative.  Review of Systems  Vital Signs: BP 113/61 mmHg  Pulse 65  Temp(Src) 98.1 F (36.7 C) (Oral)  Resp 16  Ht 5\' 4"  (1.626 m)  Wt 158 lb (71.668 kg)  BMI 27.11 kg/m2  SpO2 100%  LMP 07/23/2015  Physical Exam  Constitutional: She is oriented to person, place, and time. No distress.  HENT:  Head: Normocephalic and atraumatic.  Cardiovascular: Normal rate, regular rhythm and intact distal pulses.  Exam reveals no gallop and no friction rub.   No murmur heard. Pulmonary/Chest: Effort normal and breath sounds normal. No respiratory distress. She has no wheezes. She has no rales.  Abdominal: Soft. Bowel sounds are normal. She exhibits no distension.  Musculoskeletal: She exhibits no edema.  Neurological: She is alert and oriented to person, place, and time.  Skin: Skin is warm and dry. She is not diaphoretic.    Mallampati Score:  MD Evaluation Airway: WNL Heart: WNL Abdomen: WNL Chest/ Lungs: WNL ASA  Classification: 2 Mallampati/Airway Score: One  Imaging: Mr Pelvis W Wo Contrast  08/09/2015  CLINICAL DATA:  Patient with history of menorrhagia and irregular menstrual cycles. Multiple uterine fibroids. Preprocedural evaluation for UFE. EXAM: MRI PELVIS WITHOUT AND WITH CONTRAST TECHNIQUE: Multiplanar multisequence MR  imaging of the pelvis was performed both before and after administration of intravenous contrast. CONTRAST:  14 mL MultiHance COMPARISON:  None. FINDINGS: Uterus: Measures 9.3 x 9.6 x 12.8 cm. Estimated volume = 571 cc. There is thickening of the junctional zone demonstrated best on the sagittal T2 images measuring up to 24 mm. Fibroids: There are innumerable fibroids throughout uterus located predominantly within an intramural location with a few of the fibroids  having submucosal components. There is a dominant 6.6 x 6.2 x 5.1 cm fibroid within the left aspect of the uterine body (image 13; series 3). There is a 3.8 x 3.6 x 3.9 cm fibroid within the uterine fundus. There is a 3.2 x 3.2 x 3.2 cm fibroid within the right aspect of the uterine body. Intracavitary fibroids:  None. Pedunculated fibroids: None. Fibroid contrast enhancement: There is heterogeneous enhancement of the majority of these fibroids. No definite nonenhancing fibroids are demonstrated. Cervix:  Multiple nabothian cysts. Right ovary: Normal appearance. No adnexal mass identified. The right ovary measures 1.6 x 1.5 x 2.3 cm. Left ovary: Normal appearance. No adnexal mass identified. The left ovary measures 1.9 x 2.0 x 2.3 cm. It contains a small follicle. Free fluid:  None. Other: No other pelvic masses, lymphadenopathy, or inflammatory process identified. IMPRESSION: Enlarged fibroid uterus. These fibroids demonstrate enhancement on postcontrast imaging. Thickening of the junctional zone as can be seen with adenomyosis. Electronically Signed   By: Lovey Newcomer M.D.   On: 08/09/2015 13:32    Labs:  CBC:  Recent Labs  08/21/15 1010  WBC 3.9*  HGB 10.6*  HCT 33.0*  PLT 288    COAGS:  Recent Labs  08/21/15 1010  INR 0.99  APTT 28    BMP:  Recent Labs  08/21/15 1010  NA 139  K 3.9  CL 109  CO2 25  GLUCOSE 92  BUN 12  CALCIUM 9.1  CREATININE 0.75  GFRNONAA >60  GFRAA >60    Assessment and Plan: Symptomatic uterine fibroids Seen in full IR consult on 07/26/15 MRI Pelvis 08/09/15 reviewed, negative PAP 05/14/15, negative surgical pathology 2004, negative HCG serum  Scheduled today for image guided uterine fibroid embolization with sedation The patient has been NPO, no blood thinners taken, labs and vitals have been reviewed. Risks and Benefits discussed with the patient including, but not limited to bleeding, infection, vascular injury, contrast induced renal failure,  non-target embolization, and post embolization syndrome. All of the patient's questions were answered, patient is agreeable to proceed. Consent signed and in chart.   SignedHedy Jacob 08/21/2015, 12:02 PM

## 2015-08-21 NOTE — Procedures (Signed)
Interventional Radiology Procedure Note  Procedure:  Right CFA access, aortogram, bilateral uterine artery embolization, for symptomatic uterine fibroids.  Exoseal deployed for hemostasis   Findings: Bilateral contribution to uterus by uterine arteries, right >> left 5 beat stasis on left and right after embolization with 500-700 micrometer beads  Complications: None Recommendations:  - 24 hour obs - 4 hours right leg straight, then ambulate as tolerated - Advance diet as tolerated - scheduled toradol IV - dilaudid pca overnight, DC at 6am - May DC foley when ambulating, or at 6am - Steroid dose pak on DC in am.  - PRN zofran - Do not submerge for 7 days - Routine care   Signed,  Dulcy Fanny. Earleen Newport, DO

## 2015-08-21 NOTE — Sedation Documentation (Signed)
22fr sheath removed from right fem artery by Dr. Earleen Newport. Hemostasis achieved using exoseal closure device and manual pressure held by Melchor Amour, RTR for 5 minutes. RDP +3, RPT +2, groin level 0.

## 2015-08-22 ENCOUNTER — Other Ambulatory Visit: Payer: Self-pay | Admitting: Radiology

## 2015-08-22 ENCOUNTER — Encounter (HOSPITAL_COMMUNITY): Payer: Self-pay

## 2015-08-22 DIAGNOSIS — D251 Intramural leiomyoma of uterus: Secondary | ICD-10-CM

## 2015-08-22 MED ORDER — ACETAMINOPHEN 325 MG PO TABS
650.0000 mg | ORAL_TABLET | Freq: Four times a day (QID) | ORAL | Status: DC | PRN
  Administered 2015-08-22: 650 mg via ORAL
  Filled 2015-08-22: qty 2

## 2015-08-22 NOTE — Progress Notes (Signed)
Pt's vitals are WNL, tolerating diet and pain is under controlled. Discussed discharge instructions with patient. Patient had no questions nor concerns. Discharged to home.

## 2015-08-22 NOTE — Progress Notes (Signed)
Fentanyl PCA was d/c'd. Wasted 22 mLs in sink with Laurita Quint, RN

## 2015-08-22 NOTE — Discharge Instructions (Signed)
Uterine Artery Embolization for Fibroids, Care After Refer to this sheet in the next few weeks. These instructions provide you with information on caring for yourself after your procedure. Your health care provider may also give you more specific instructions. Your treatment has been planned according to current medical practices, but problems sometimes occur. Call your health care provider if you have any problems or questions after your procedure. WHAT TO EXPECT AFTER THE PROCEDURE After your procedure, it is typical to have cramping in the pelvis. You will be given pain medicine to control it. HOME CARE INSTRUCTIONS  Only take over-the-counter or prescription medicines for pain, discomfort, or fever as directed by your health care provider.  Do not take aspirin. It can cause bleeding.  Follow your health care provider's advice regarding medicines given to you, diet, activity, and when to begin sexual activity.  See your health care provider for follow-up care as directed. SEEK MEDICAL CARE IF:  You have a fever.  You have redness, swelling, and pain around your incision site.  You have pus draining from your incision.  You have a rash. SEEK IMMEDIATE MEDICAL CARE IF:  You have bleeding from your incision site.  You have difficulty breathing.  You have chest pain.  You have severe abdominal pain.  You have leg pain.  You become dizzy and faint.   This information is not intended to replace advice given to you by your health care provider. Make sure you discuss any questions you have with your health care provider.   Document Released: 08/03/2013 Document Reviewed: 08/03/2013 Elsevier Interactive Patient Education 2016 [place]. Uterine Artery Embolization for Fibroids Uterine artery embolization is a nonsurgical treatment to shrink fibroids. A thin plastic tube (catheter) is used to inject material that blocks off the blood supply to the fibroid, which causes the  fibroid to shrink. LET [facility] CARE PROVIDER KNOW ABOUT:  Any allergies you have.  All medicines you are taking, including vitamins, herbs, eye drops, creams, and over-the-counter medicines.  Previous problems you or members of your family have had with the use of anesthetics.  Any blood disorders you have.  Previous surgeries you have had.  Medical conditions you have. RISKS AND COMPLICATIONS  Injury to the uterus from decreased blood supply  Infection.  Blood infection (septicemia).  Lack of menstrual periods (amenorrhea).  Death of tissue cells (necrosis) around your bladder or vulva.  Development of a hole between organs or from an organ to the surface of your skin (fistula).  Blood clot in the legs (deep vein thrombosis) or lung (pulmonary embolus). BEFORE THE PROCEDURE  Ask your health care provider about changing or stopping your regular medicines.   Do not take aspirin or blood thinners (anticoagulants) for 1 week before the surgery or as directed by your health care provider.  Do not eat or drink anything for 8 hours before the surgery or as directed by your health care provider.   Empty your bladder before the procedure begins. PROCEDURE   An IV tube will be placed into one of your veins. This will be used to give you a sedative and pain medication (conscious sedation).  You will be given a medicine that numbs the area (local anesthetic).  A small cut will be made in your groin. A catheter is then inserted into the main artery of your leg.  The catheter will be guided through the artery to your uterus. A series of images will be taken while dye is injected  through the catheter in your groin. X-rays are taken at the same time. This is done to provide a road map of the blood supply to your uterus and fibroids.  Tiny plastic spheres, about the size of sand grains, will be injected through the catheter. Metal coils may be used to help block the artery. The  particles will lodge in tiny branches of the uterine artery that supplies blood to the fibroids.  The procedure is repeated on the artery that supplies the other side of the uterus.  The catheter is then removed and pressure is held to stop any bleeding. No stitches are needed.  A dressing is then placed over the cut (incision). AFTER THE PROCEDURE  You will be taken to a recovery area where your progress will be monitored until you are awake, stable, and taking fluids well. If there are no other problems, you will then be moved to a regular hospital room.  You will be observed overnight in the hospital.  You will have cramping that should be controlled with pain medication.   This information is not intended to replace advice given to you by your health care provider. Make sure you discuss any questions you have with your health care provider.   Document Released: 12/29/2005 Document Revised: 08/03/2013 Document Reviewed: 04/28/2013 Elsevier Interactive Patient Education 2016 [place]. Uterine Artery Embolization for Fibroids, Care After Refer to this sheet in the next few weeks. These instructions provide you with information on caring for yourself after your procedure. Your health care provider may also give you more specific instructions. Your treatment has been planned according to current medical practices, but problems sometimes occur. Call your health care provider if you have any problems or questions after your procedure. WHAT TO EXPECT AFTER THE PROCEDURE After your procedure, it is typical to have cramping in the pelvis. You will be given pain medicine to control it. HOME CARE INSTRUCTIONS  Only take over-the-counter or prescription medicines for pain, discomfort, or fever as directed by your health care provider.  Do not take aspirin. It can cause bleeding.  Follow your health care provider's advice regarding medicines given to you, diet, activity, and when to begin  sexual activity.  See your health care provider for follow-up care as directed. SEEK MEDICAL CARE IF:  You have a fever.  You have redness, swelling, and pain around your incision site.  You have pus draining from your incision.  You have a rash. SEEK IMMEDIATE MEDICAL CARE IF:  You have bleeding from your incision site.  You have difficulty breathing.  You have chest pain.  You have severe abdominal pain.  You have leg pain.  You become dizzy and faint.   This information is not intended to replace advice given to you by your health care provider. Make sure you discuss any questions you have with your health care provider.   Document Released: 08/03/2013 Document Reviewed: 08/03/2013 Elsevier Interactive Patient Education [employer].

## 2015-08-22 NOTE — Discharge Summary (Signed)
Patient ID: Rebecca Lindsey MRN: [id number] DOB/AGE: [birth year] 49 y.o.  Admit date: 08/21/2015 Discharge date: 08/22/2015  Admission Diagnoses: Symptomatic uterine fibroids  Discharge Diagnoses: Symptomatic uterine fibroids, status post bilateral uterine artery embolization on 08/21/15 Active Problems:   Fibroids, intramural  Past Medical History  Diagnosis Date  . Fibroid   . Anemia    Past Surgical History  Procedure Laterality Date  . Cesarean section    . Tubal ligation    . Myomectomy  2004  . +-        Discharged Condition: good  Hospital Course: Ms. Rebecca Lindsey  is a 49 year old black female, patient of Rebecca Lindsey, who was recently referred to the interventional radiology service for treatment options regarding symptomatic uterine fibroids. She was seen by Rebecca Lindsey and deemed an appropriate candidate for bilateral uterine artery embolization. On 08/21/15 the patient underwent successful bilateral uterine artery embolization via IV conscious sedation. The procedure was performed without immediate complications and the patient was admitted for overnight observation. She was initially placed on PCA Dilaudid pump and subsequently changed to fentanyl. She did experience some nausea and vomiting overnight which was treated with anti emetics. On the morning of discharge the patient was stable. Nausea and vomiting had resolved. She was able to void, ambulate and tolerate her diet without difficulty. She was seen by Rebecca Lindsey and deemed stable for discharge at this time. She was given prescriptions for ibuprofen 600 mg, #15, no refills, 1 tablet every 8 hours for the next 5 days, Colace 100 mg, #20, no refills, 1 tablet twice daily as needed for constipation, Medrol Dosepak 4 mg, unit of use 21, no refills, take as directed, Norco 5/325, #30, no refills, 1-2 tablets every 4-6 hours as needed for moderate to severe pain, Zofran 8 mg, #20, no refills, 1 tablet twice a day  as needed for nausea. The patient will be scheduled for follow-up with Rebecca Lindsey in the interventional radiology clinic in 2-4 weeks. She will continue her current home medications and continue care with Rebecca Lindsey as scheduled.  Consults: none  Significant Diagnostic Studies:  Results for orders placed or performed during the hospital encounter of 08/21/15  APTT  Result Value Ref Range   aPTT 28 24 - 37 seconds  Basic metabolic panel  Result Value Ref Range   Sodium 139 135 - 145 mmol/L   Potassium 3.9 3.5 - 5.1 mmol/L   Chloride 109 101 - 111 mmol/L   CO2 25 22 - 32 mmol/L   Glucose, Bld 92 65 - 99 mg/dL   BUN 12 6 - 20 mg/dL   Creatinine, Ser 0.75 0.44 - 1.00 mg/dL   Calcium 9.1 8.9 - 10.3 mg/dL   GFR calc non Af Amer >60 >60 mL/min   GFR calc Af Amer >60 >60 mL/min   Anion gap 5 5 - 15  CBC with Differential/Platelet  Result Value Ref Range   WBC 3.9 (L) 4.0 - 10.5 K/uL   RBC 3.97 3.87 - 5.11 MIL/uL   Hemoglobin 10.6 (L) 12.0 - 15.0 g/dL   HCT 33.0 (L) 36.0 - 46.0 %   MCV 83.1 78.0 - 100.0 fL   MCH 26.7 26.0 - 34.0 pg   MCHC 32.1 30.0 - 36.0 g/dL   RDW 20.6 (H) 11.5 - 15.5 %   Platelets 288 150 - 400 K/uL   Neutrophils Relative % 42 %   Neutro Abs 1.6 (L) 1.7 - 7.7 K/uL   Lymphocytes  Relative 44 %   Lymphs Abs 1.7 0.7 - 4.0 K/uL   Monocytes Relative 11 %   Monocytes Absolute 0.4 0.1 - 1.0 K/uL   Eosinophils Relative 2 %   Eosinophils Absolute 0.1 0.0 - 0.7 K/uL   Basophils Relative 1 %   Basophils Absolute 0.0 0.0 - 0.1 K/uL  hCG, serum, qualitative  Result Value Ref Range   Preg, Serum NEGATIVE NEGATIVE  Protime-INR  Result Value Ref Range   Prothrombin Time 13.3 11.6 - 15.2 seconds   INR 0.99 0.00 - 1.49     Treatments: Successful bilateral uterine artery embolization on 08/21/15  Discharge Exam: Blood pressure 107/53, pulse 67, temperature 98.4 F (36.9 C), temperature source Oral, resp. rate 18, height 5\' 4"  (1.626 m), weight 158 lb (71.668 kg), last  menstrual period 07/23/2015, SpO2 100 %. Patient is awake, alert. Chest is clear to auscultation bilaterally. Heart with regular rate and rhythm. Abdomen soft, positive bowel sounds, nontender; puncture site right common femoral artery clean, dry, nontender, no hematoma. Lower extremities with intact distal pulses and no edema.  Disposition: home     Medication List    TAKE these medications        multivitamin with minerals Tabs tablet  Take 1 tablet by mouth daily.     NU-IRON 150 MG capsule  Generic drug:  iron polysaccharides  Take 150 mg by mouth 2 (two) times daily.           Follow-up Information    Follow up with WAGNER, JAIME, DO.   Specialty:  Interventional Radiology   Why:  Radiology will call you with follow up appointment with Rebecca Lindsey in 2-4 weeks; please call [phone] or [phone] with any questions or concerns.   Contact information:   [address] [place] [address] [phone]       Follow up with Marvene Staff, MD.   Specialty:  Obstetrics and Gynecology   Why:  Resume care with Rebecca Lindsey as scheduled   Contact information:   [address] [place] [address] [phone]        Signed: D. Rowe Robert 08/22/2015, 12:35 PM   I have spent less than 30 minutes discharging Rebecca Lindsey.

## 2015-08-22 NOTE — Progress Notes (Signed)
Pt's vital's WNL, tolerating diet and pain is under control. Discussed discharge instructions with patient. Patient had no questions nor concerns. Discharged to home.

## 2015-09-11 ENCOUNTER — Ambulatory Visit
Admission: RE | Admit: 2015-09-11 | Discharge: 2015-09-11 | Disposition: A | Discharge status: Home / Self Care | Payer: 59 | Source: Ambulatory Visit | Attending: Radiology | Admitting: Radiology

## 2015-09-11 DIAGNOSIS — D251 Intramural leiomyoma of uterus: Secondary | ICD-10-CM

## 2015-09-11 NOTE — Progress Notes (Signed)
   Referring Physician(s): DR Servando Salina  Chief Complaint: The patient is seen in follow up today s/p Bilateral uterine artery embolization 08/21/2015  History of present illness:  Pt doing quite well Denies pain; N/V Denies fever Does continues to have daily spotting - changes panty liner 2 x/day No cramping LMP 08/13/15---only spotting since then No "normal" cycle yet after procedure   Past Medical History  Diagnosis Date  . Fibroid   . Anemia     Past Surgical History  Procedure Laterality Date  . Cesarean section    . Tubal ligation    . Myomectomy  2004  . +-      Allergies: Review of patient's allergies indicates no known allergies.  Medications: Prior to Admission medications   Medication Sig Start Date End Date Taking? Authorizing Provider  iron polysaccharides (NU-IRON) 150 MG capsule Take 150 mg by mouth 2 (two) times daily.    Historical Provider, MD  Multiple Vitamin (MULTIVITAMIN WITH MINERALS) TABS tablet Take 1 tablet by mouth daily.    Historical Provider, MD     No family history on file.  Social History   Social History  . Marital Status: Single    Spouse Name: N/A  . Number of Children: N/A  . Years of Education: N/A   Social History Main Topics  . Smoking status: Never Smoker   . Smokeless tobacco: Never Used  . Alcohol Use: 0.0 oz/week    0 Standard drinks or equivalent per week     Comment: occasional glass of wine    . Drug Use: No  . Sexual Activity: Not on file   Other Topics Concern  . Not on file   Social History Narrative     Vital Signs: BP 112/64 mmHg  Pulse 68  Temp(Src) 98.2 F (36.8 C) (Oral)  Resp 14  SpO2 98%  LMP 08/13/2015 (Approximate)  Physical Exam  Constitutional: She appears well-nourished.  Cardiovascular: Normal rate and regular rhythm.   Pulmonary/Chest: Breath sounds normal. She has no wheezes.  Abdominal: Soft. Bowel sounds are normal. There is no tenderness.  Musculoskeletal:  Normal range of motion.  Neurological: She is alert.  Skin: Skin is warm and dry.  Psychiatric: She has a normal mood and affect. Her behavior is normal. Judgment and thought content normal.  Nursing note and vitals reviewed.   Imaging: No results found.  Labs:  CBC:  Recent Labs  08/21/15 1010  WBC 3.9*  HGB 10.6*  HCT 33.0*  PLT 288    COAGS:  Recent Labs  08/21/15 1010  INR 0.99  APTT 28    BMP:  Recent Labs  08/21/15 1010  NA 139  K 3.9  CL 109  CO2 25  GLUCOSE 92  BUN 12  CALCIUM 9.1  CREATININE 0.75  GFRNONAA >60  GFRAA >60    LIVER FUNCTION TESTS: No results for input(s): BILITOT, AST, ALT, ALKPHOS, PROT, ALBUMIN in the last 8760 hours.  Assessment:  Follow up post B [place] 08/21/15 Doing well Spotting considered normal at this point Dr Earleen Newport has seen pt and reassuring to her Plan to follow with an MRI in 3 mo She has good understanding of this plan  Signed: Eiko Mcgowen A 09/11/2015, 12:05 PM   Please refer to Dr. Earleen Newport attestation of this note for management and plan.

## 2015-11-13 ENCOUNTER — Telehealth: Payer: Self-pay | Admitting: Radiology

## 2015-11-13 NOTE — Telephone Encounter (Signed)
3 mo s/p [place] call for update:  Pt reports that she is doing well overall.  Experienced approx 4 wks of spotting after [place].  Has not had a menstrual cycle post [place].   Denies cramping.  Patient prefers to postpone follow up app't for 6 mos s/p [place].  Shaday Rayborn [place], RN 11/13/2015 12:12 PM

## 2016-01-08 ENCOUNTER — Other Ambulatory Visit: Payer: Self-pay | Admitting: Interventional Radiology

## 2016-01-08 DIAGNOSIS — D259 Leiomyoma of uterus, unspecified: Secondary | ICD-10-CM

## 2016-01-15 ENCOUNTER — Encounter: Payer: Self-pay | Admitting: Radiology

## 2018-11-08 ENCOUNTER — Other Ambulatory Visit: Payer: Self-pay | Admitting: Family Medicine

## 2018-11-08 ENCOUNTER — Other Ambulatory Visit (HOSPITAL_COMMUNITY)
Admission: RE | Admit: 2018-11-08 | Discharge: 2018-11-08 | Disposition: A | Discharge status: Home / Self Care | Payer: 59 | Source: Ambulatory Visit | Attending: Family Medicine | Admitting: Family Medicine

## 2018-11-08 DIAGNOSIS — Z01411 Encounter for gynecological examination (general) (routine) with abnormal findings: Secondary | ICD-10-CM | POA: Insufficient documentation

## 2018-11-15 LAB — CYTOLOGY - PAP
Diagnosis: UNDETERMINED — AB
HPV (WINDOPATH): NOT DETECTED

## 2019-05-30 ENCOUNTER — Other Ambulatory Visit: Payer: Self-pay | Admitting: Family Medicine

## 2019-05-30 ENCOUNTER — Other Ambulatory Visit (HOSPITAL_COMMUNITY)
Admission: RE | Admit: 2019-05-30 | Discharge: 2019-05-30 | Disposition: A | Discharge status: Home / Self Care | Payer: 59 | Source: Ambulatory Visit | Attending: Family Medicine | Admitting: Family Medicine

## 2019-05-30 DIAGNOSIS — R87619 Unspecified abnormal cytological findings in specimens from cervix uteri: Secondary | ICD-10-CM | POA: Diagnosis not present

## 2019-06-02 LAB — CYTOLOGY - PAP: Diagnosis: NEGATIVE

## 2020-04-26 ENCOUNTER — Ambulatory Visit: Payer: 59 | Attending: Internal Medicine

## 2020-04-26 DIAGNOSIS — Z23 Encounter for immunization: Secondary | ICD-10-CM

## 2020-04-26 NOTE — Progress Notes (Signed)
   Covid-19 Vaccination Clinic  Name:  Rebecca Lindsey    MRN: [id number] DOB: 1966/07/24  04/26/2020  [name] was observed post Covid-19 immunization for 15 minutes without incident. She was provided with Vaccine Information Sheet and instruction to access the V-Safe system.   [name] was instructed to call 911 with any severe reactions post vaccine: Marland Kitchen Difficulty breathing  . Swelling of face and throat  . A fast heartbeat  . A bad rash all over body  . Dizziness and weakness   Immunizations Administered    Name Date Dose VIS Date Route   Pfizer COVID-19 Vaccine 04/26/2020  4:30 PM 0.3 mL 12/21/2018 Intramuscular   Manufacturer: [place]   Lot: [id number]   [place]: [id number]

## 2020-05-17 ENCOUNTER — Ambulatory Visit: Payer: 59 | Attending: Internal Medicine

## 2020-08-09 ENCOUNTER — Other Ambulatory Visit: Payer: Self-pay | Admitting: Family Medicine

## 2020-08-09 DIAGNOSIS — Z1231 Encounter for screening mammogram for malignant neoplasm of breast: Secondary | ICD-10-CM

## 2020-09-05 ENCOUNTER — Other Ambulatory Visit: Payer: Self-pay

## 2020-09-05 ENCOUNTER — Ambulatory Visit
Admission: RE | Admit: 2020-09-05 | Discharge: 2020-09-05 | Disposition: A | Discharge status: Home / Self Care | Payer: 59 | Source: Ambulatory Visit | Attending: Family Medicine | Admitting: Family Medicine

## 2020-09-05 DIAGNOSIS — Z1231 Encounter for screening mammogram for malignant neoplasm of breast: Secondary | ICD-10-CM

## 2021-07-07 ENCOUNTER — Encounter (HOSPITAL_BASED_OUTPATIENT_CLINIC_OR_DEPARTMENT_OTHER): Payer: Self-pay | Admitting: Emergency Medicine

## 2021-07-07 ENCOUNTER — Other Ambulatory Visit: Payer: Self-pay

## 2021-07-07 ENCOUNTER — Emergency Department (HOSPITAL_BASED_OUTPATIENT_CLINIC_OR_DEPARTMENT_OTHER)
Admission: EM | Admit: 2021-07-07 | Discharge: 2021-07-08 | Disposition: A | Discharge status: Home / Self Care | Payer: 59 | Attending: Emergency Medicine | Admitting: Emergency Medicine

## 2021-07-07 DIAGNOSIS — S0101XA Laceration without foreign body of scalp, initial encounter: Secondary | ICD-10-CM | POA: Diagnosis not present

## 2021-07-07 DIAGNOSIS — S0990XA Unspecified injury of head, initial encounter: Secondary | ICD-10-CM

## 2021-07-07 DIAGNOSIS — Y93E5 Activity, floor mopping and cleaning: Secondary | ICD-10-CM | POA: Diagnosis not present

## 2021-07-07 DIAGNOSIS — W208XXA Other cause of strike by thrown, projected or falling object, initial encounter: Secondary | ICD-10-CM | POA: Insufficient documentation

## 2021-07-07 NOTE — ED Triage Notes (Signed)
Pt presents to ED POV. Pt c/o injury to top of head. Pt reports that she was standing up a couch and it fell on the top of her head. Pt reports mild nausea since. Denies LOC, denies blood thinners. Reports tylenol pta is relieving pain.

## 2021-07-08 ENCOUNTER — Emergency Department (HOSPITAL_BASED_OUTPATIENT_CLINIC_OR_DEPARTMENT_OTHER): Payer: 59

## 2021-07-08 MED ORDER — HYDROCODONE-ACETAMINOPHEN 5-325 MG PO TABS
1.0000 | ORAL_TABLET | Freq: Once | ORAL | Status: AC
  Administered 2021-07-08: 1 via ORAL
  Filled 2021-07-08: qty 1

## 2021-07-08 NOTE — ED Provider Notes (Signed)
[facility] EMERGENCY DEPT Provider Note   CSN: [id number] Arrival date & time: 07/07/21  2248     History Chief Complaint  Patient presents with   Head Injury    Rebecca Lindsey is a 55 y.o. female.  HPI     This a 55 year old female with a history of anemia who presents with a head injury.  Patient reports that she was cleaning under a couch that she had stood on its end.  The couch fell hitting her on the head.  She denies loss of consciousness.  This happened around 8:30 PM.  She denies significant pain as she took Tylenol which seemed to help.  She did note a bump on her head and laceration.  Tetanus is up-to-date.  She reports nausea without vomiting.  No vision changes, weakness, numbness.  Patient is not on any blood thinners.  Past Medical History:  Diagnosis Date   Anemia    Fibroid     Patient Active Problem List   Diagnosis Date Noted   Fibroids, intramural 08/21/2015   Menorrhagia with irregular cycle    Fibroid, uterine     Past Surgical History:  Procedure Laterality Date   +-     CESAREAN SECTION     MYOMECTOMY  2004   TUBAL LIGATION       OB History   No obstetric history on file.     Family History  Problem Relation Age of Onset   Breast cancer Neg Hx     Social History   Tobacco Use   Smoking status: Never   Smokeless tobacco: Never  Substance Use Topics   Alcohol use: Yes    Alcohol/week: 0.0 standard drinks    Comment: occasional glass of wine     Drug use: No    Home Medications Prior to Admission medications   Medication Sig Start Date End Date Taking? Authorizing Provider  iron polysaccharides (NU-IRON) 150 MG capsule Take 150 mg by mouth 2 (two) times daily.    [provider]  Multiple Vitamin (MULTIVITAMIN WITH MINERALS) TABS tablet Take 1 tablet by mouth daily.    [provider]    Allergies    Patient has no known allergies.  Review of Systems   Review of Systems  Constitutional:   Negative for fever.  Gastrointestinal:  Positive for nausea. Negative for vomiting.  Skin:  Positive for wound.  Neurological:  Positive for headaches. Negative for weakness and numbness.  All other systems reviewed and are negative.  Physical Exam Updated Vital Signs BP 135/83 (BP Location: Right Arm)   Pulse (!) 56   Temp 97.8 F (36.6 C) (Oral)   Resp 18   Ht 1.626 m ('5\' 4"'$ )   Wt 74.8 kg   LMP 08/13/2015 (Approximate)   SpO2 100%   BMI 28.32 kg/m   Physical Exam Vitals and nursing note reviewed.  Constitutional:      Appearance: She is well-developed. She is not ill-appearing.  HENT:     Head: Normocephalic.     Comments: Small 2 cm X shaped laceration top of the scalp, no significant gaping, no active bleeding    Ears:     Comments: No hemotympanum    Nose: Nose normal.     Mouth/Throat:     Mouth: Mucous membranes are moist.  Eyes:     Extraocular Movements: Extraocular movements intact.     Pupils: Pupils are equal, round, and reactive to light.  Cardiovascular:  Rate and Rhythm: Normal rate and regular rhythm.     Heart sounds: Normal heart sounds.  Pulmonary:     Effort: Pulmonary effort is normal. No respiratory distress.     Breath sounds: No wheezing.  Abdominal:     Palpations: Abdomen is soft.  Musculoskeletal:     Cervical back: Normal range of motion and neck supple.     Right lower leg: No edema.     Left lower leg: No edema.  Skin:    General: Skin is warm and dry.  Neurological:     Mental Status: She is alert and oriented to person, place, and time.  Psychiatric:        Mood and Affect: Mood normal.    ED Results / Procedures / Treatments   Labs (all labs ordered are listed, but only abnormal results are displayed) Labs Reviewed - No data to display  EKG None  Radiology CT HEAD WO CONTRAST (5MM)  Result Date: 07/08/2021 CLINICAL DATA:  Initial evaluation for acute head trauma. EXAM: CT HEAD WITHOUT CONTRAST TECHNIQUE: Contiguous  axial images were obtained from the base of the skull through the vertex without intravenous contrast. COMPARISON:  None. FINDINGS: Brain: Cerebral volume within normal limits for patient age. No evidence for acute intracranial hemorrhage. No findings to suggest acute large vessel territory infarct. No mass lesion, midline shift, or mass effect. Ventricles are normal in size without evidence for hydrocephalus. No extra-axial fluid collection identified. Mild cerebellar tonsillar ectopia without frank Chiari malformation. Vascular: No hyperdense vessel identified. Skull: No definite visible scalp soft tissue abnormality. Calvarium intact. Sinuses/Orbits: Globes and orbital soft tissues within normal limits. Visualized paranasal sinuses are clear. No mastoid effusion. IMPRESSION: Negative head CT.  No acute intracranial abnormality. Electronically Signed   By: Jeannine Boga M.D.   On: 07/08/2021 00:25    Procedures Procedures   Medications Ordered in ED Medications  HYDROcodone-acetaminophen (NORCO/VICODIN) 5-325 MG per tablet 1 tablet (has no administration in time range)    ED Course  I have reviewed the triage vital signs and the nursing notes.  Pertinent labs & imaging results that were available during my care of the patient were reviewed by me and considered in my medical decision making (see chart for details).    MDM Rules/Calculators/A&P                           Patient presents with head injury.  She is nontoxic and vital signs are reassuring.  She has a small laceration to the top of the scalp.  She is awake, alert, oriented.  She does report some nausea.  No vomiting.  Low suspicion for intracranial injury; however, given nausea, will obtain head CT to rule out bleed.   Patient could have a mild concussion.  Her tetanus is up-to-date.  We discussed laceration repair versus not.  This will likely heal on its own as it does not bleed actively bleeding and not gaping.  Otherwise I  offered to place 1 staple.  Patient elected to proceed without repair.  CT scan obtained and reviewed.  CT without any intracranial bleed.  Patient reassured.  Given concussion precautions.  After history, exam, and medical workup I feel the patient has been appropriately medically screened and is safe for discharge home. Pertinent diagnoses were discussed with the patient. Patient was given return precautions.  Final Clinical Impression(s) / ED Diagnoses Final diagnoses:  Minor head injury, initial encounter  Laceration of scalp, initial encounter    Rx / DC Orders ED Discharge Orders     None        Wessie Shanks, Barbette Hair, MD 07/08/21 0100

## 2021-07-08 NOTE — Discharge Instructions (Addendum)
You were seen today and have a minor head injury as well as a scalp laceration.  Your CT scan is negative.  Be careful regarding the scalp laceration.  This will likely heal fine on its own.  Avoid vigorous hair washing or reinjury.  You may have a headache for the next 1 to 2 days.  Take Tylenol or ibuprofen as needed.

## 2022-04-17 IMAGING — CT CT HEAD W/O CM
4 series · 16 of 47 positions shown, 18 images · non-contrast
Comparison: None.

CLINICAL DATA: Initial evaluation for acute head trauma.

EXAM:
CT HEAD WITHOUT CONTRAST
TECHNIQUE: Contiguous axial images were obtained from the base of the skull
through the vertex without intravenous contrast.

[Series 2: head wo · axial · 0.40mm/px · z∈[-106,+4]mm · 7 of 30 slices shown, 9 images]
[im 4/30  brain]
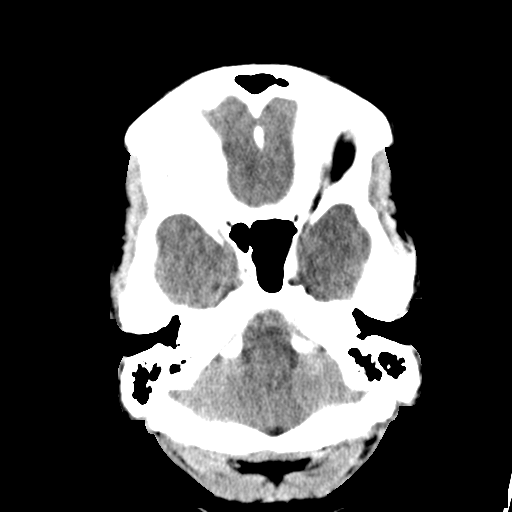
[im 4/30  bone]
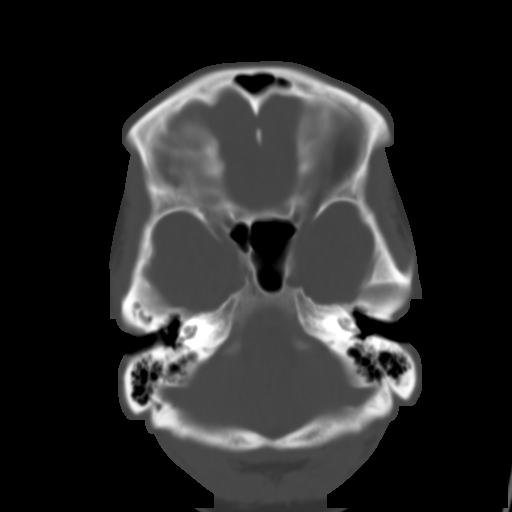
[im 8/30  brain]
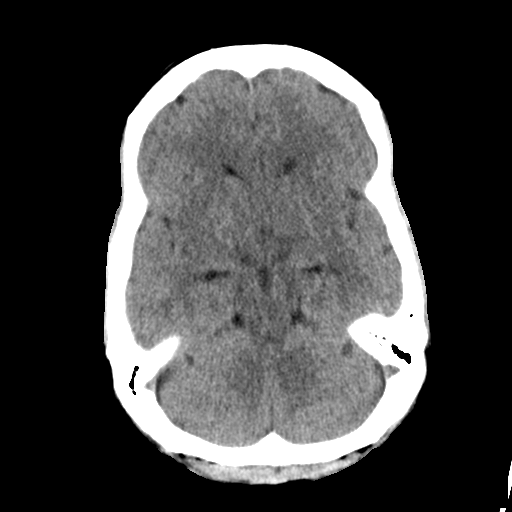
[im 11/30  brain]
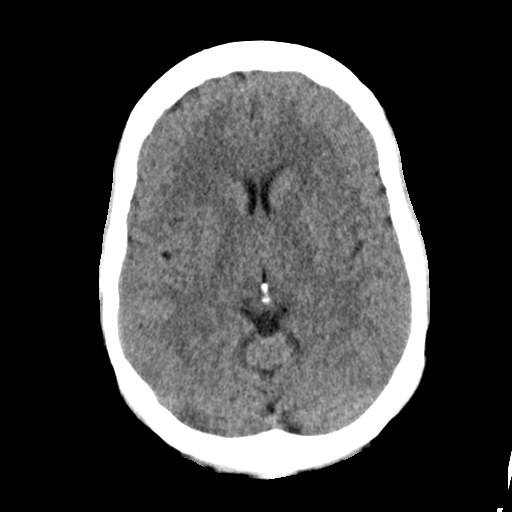
[im 15/30  brain]
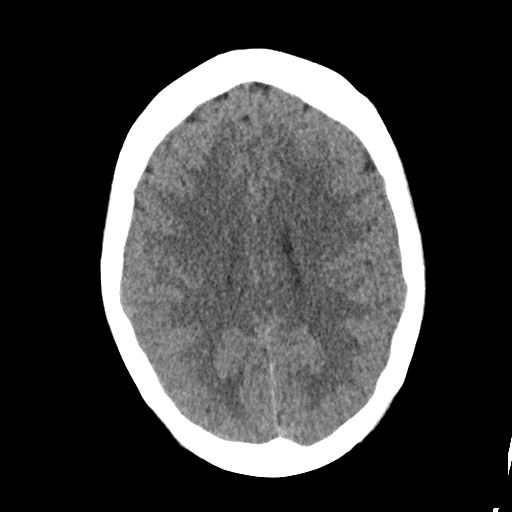
[im 19/30  brain]
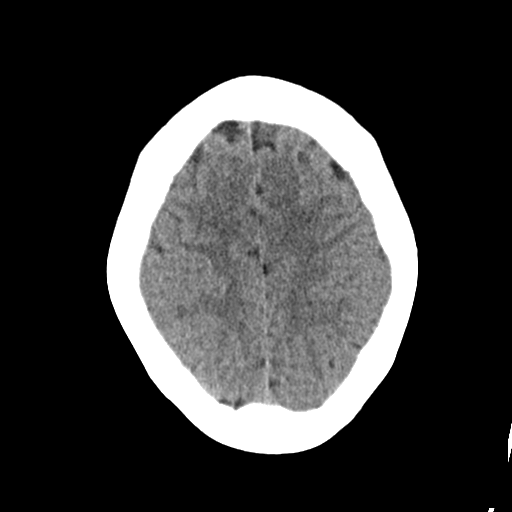
[im 19/30  bone]
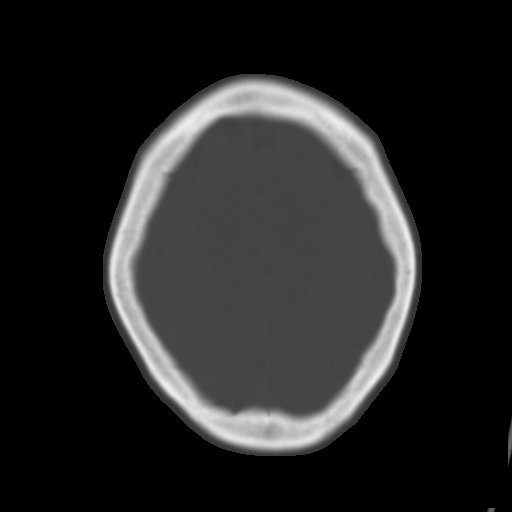
[im 22/30  brain]
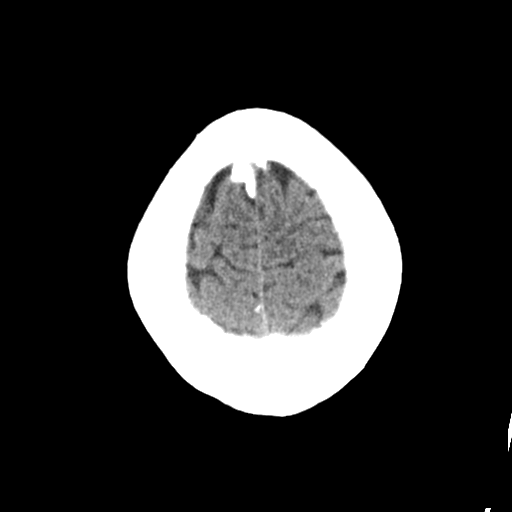
[im 26/30  brain]
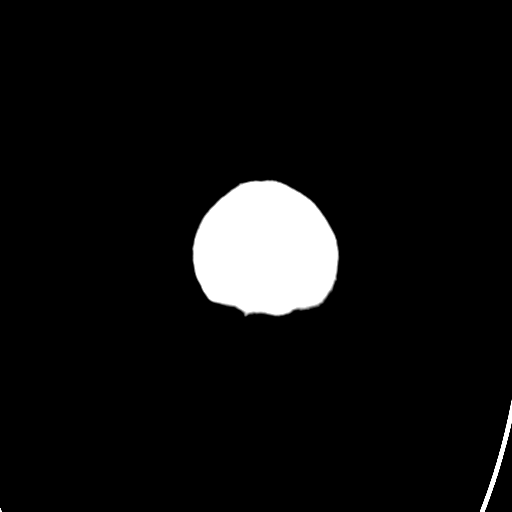

[Series 3: head bone · axial · 0.40mm/px · z∈[-107,-79]mm · 3 of 74 slices shown]
[im 8/74  bone]
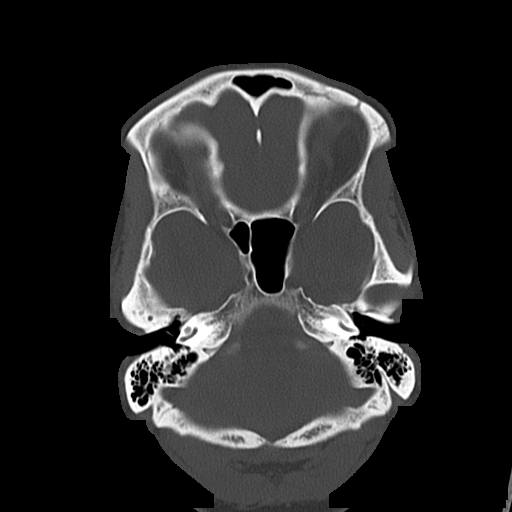
[im 15/74  bone]
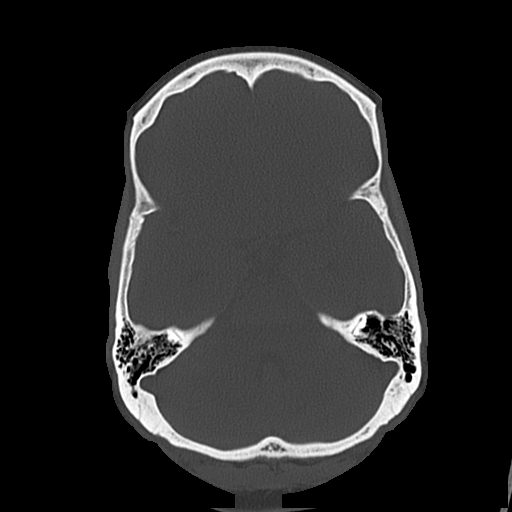
[im 22/74  bone]
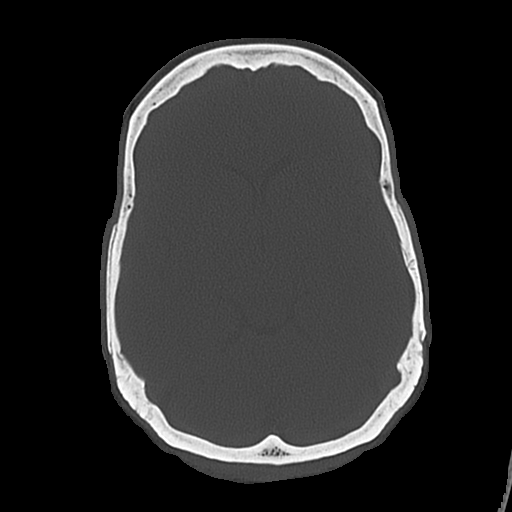

[Series 4: coronal soft · coronal · 0.27mm/px · 3 of 64 slices shown]
[im 22/64  brain]
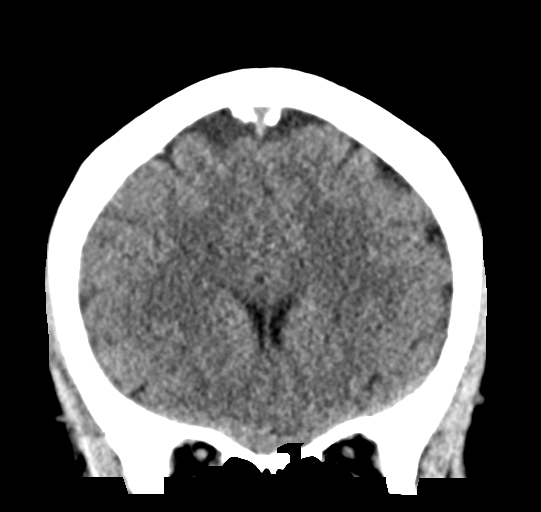
[im 29/64  brain]
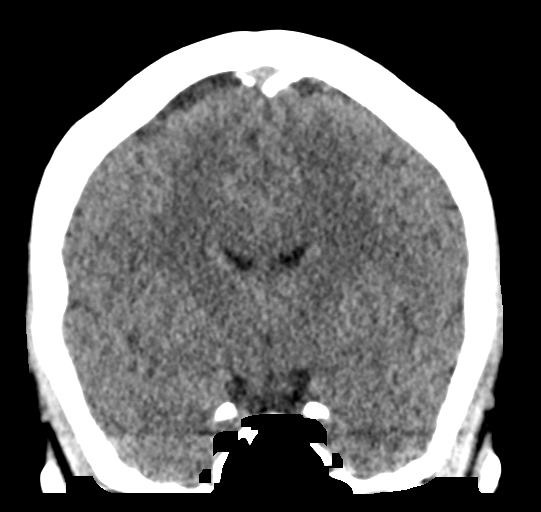
[im 36/64  brain]
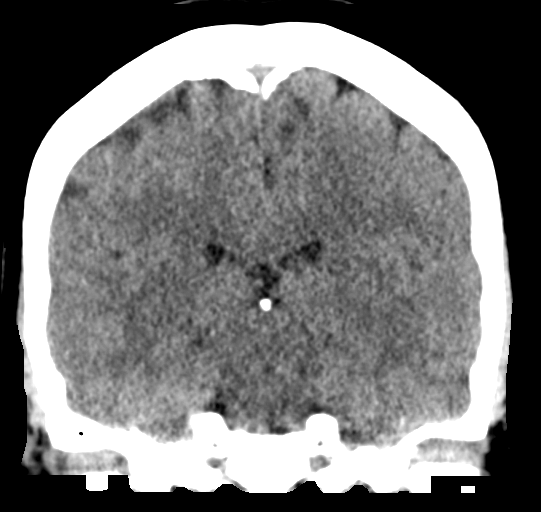

[Series 5: sagittal soft · sagittal · 0.27mm/px · 3 of 50 slices shown]
[im 17/50  brain]
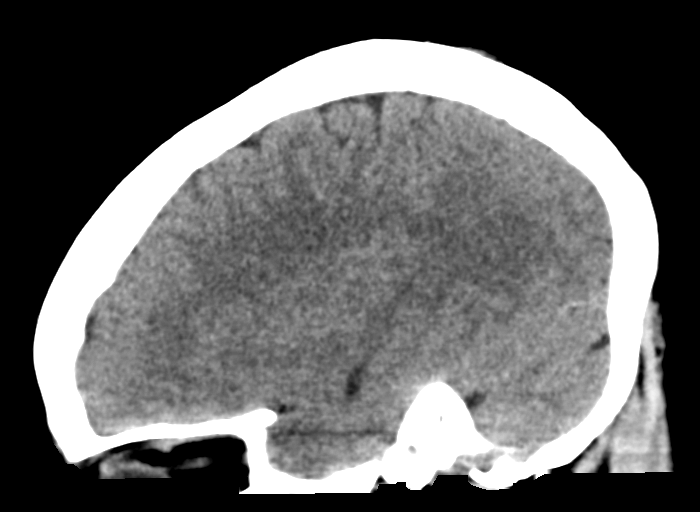
[im 25/50  brain]
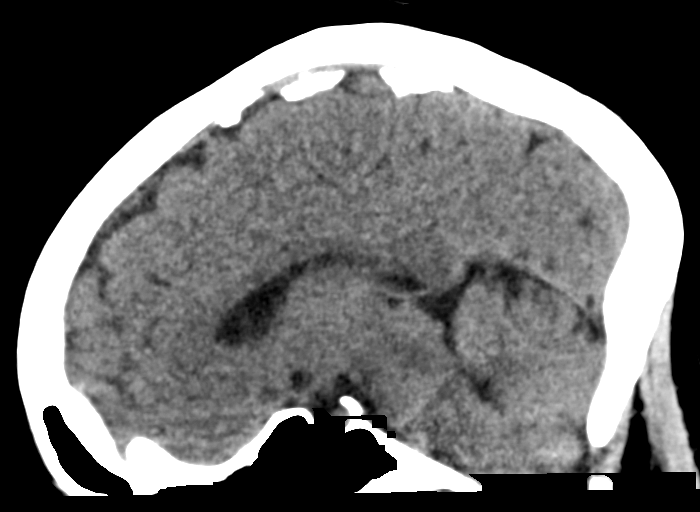
[im 33/50  brain]
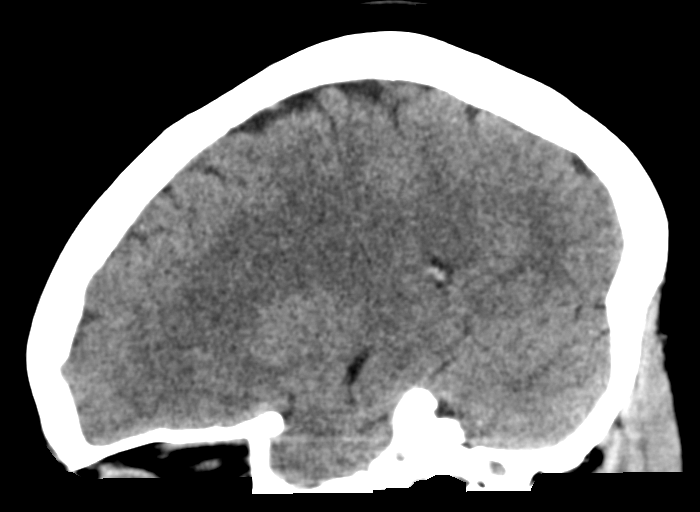

[16 of 47 positions shown; findings below may reference images not displayed]

FINDINGS: Brain: Cerebral volume within normal limits for patient age.

No evidence for acute intracranial hemorrhage. No findings to
suggest acute large vessel territory infarct. No mass lesion,
midline shift, or mass effect. Ventricles are normal in size without
evidence for hydrocephalus. No extra-axial fluid collection
identified. Mild cerebellar tonsillar ectopia without frank Chiari
malformation.

Vascular: No hyperdense vessel identified.

Skull: No definite visible scalp soft tissue abnormality. Calvarium
intact.

Sinuses/Orbits: Globes and orbital soft tissues within normal
limits.

Visualized paranasal sinuses are clear. No mastoid effusion.
IMPRESSION: Negative head CT.  No acute intracranial abnormality.

## 2023-04-29 ENCOUNTER — Encounter: Payer: Self-pay | Admitting: Family Medicine

## 2023-04-29 ENCOUNTER — Ambulatory Visit: Payer: No Typology Code available for payment source | Admitting: Family Medicine

## 2023-04-29 VITALS — BP 108/70 | HR 76 | Temp 98.5°F | Ht 64.5 in | Wt 169.6 lb

## 2023-04-29 DIAGNOSIS — E782 Mixed hyperlipidemia: Secondary | ICD-10-CM | POA: Diagnosis not present

## 2023-04-29 DIAGNOSIS — Z7689 Persons encountering health services in other specified circumstances: Secondary | ICD-10-CM

## 2023-04-29 DIAGNOSIS — N951 Menopausal and female climacteric states: Secondary | ICD-10-CM | POA: Insufficient documentation

## 2023-04-29 LAB — CBC WITH DIFFERENTIAL/PLATELET
Basophils Absolute: 0 10*3/uL (ref 0.0–0.1)
Basophils Relative: 0.8 % (ref 0.0–3.0)
Eosinophils Absolute: 0.1 10*3/uL (ref 0.0–0.7)
Eosinophils Relative: 1.2 % (ref 0.0–5.0)
HCT: 38.7 % (ref 36.0–46.0)
Hemoglobin: 12.4 g/dL (ref 12.0–15.0)
Lymphocytes Relative: 48.6 % — ABNORMAL HIGH (ref 12.0–46.0)
Lymphs Abs: 2.1 10*3/uL (ref 0.7–4.0)
MCHC: 32.1 g/dL (ref 30.0–36.0)
MCV: 89.1 fl (ref 78.0–100.0)
Monocytes Absolute: 0.4 10*3/uL (ref 0.1–1.0)
Monocytes Relative: 8.8 % (ref 3.0–12.0)
Neutro Abs: 1.7 10*3/uL (ref 1.4–7.7)
Neutrophils Relative %: 40.6 % — ABNORMAL LOW (ref 43.0–77.0)
Platelets: 222 10*3/uL (ref 150.0–400.0)
RBC: 4.34 Mil/uL (ref 3.87–5.11)
RDW: 13.6 % (ref 11.5–15.5)
WBC: 4.3 10*3/uL (ref 4.0–10.5)

## 2023-04-29 LAB — LIPID PANEL
Cholesterol: 231 mg/dL — ABNORMAL HIGH (ref 0–200)
HDL: 57.3 mg/dL (ref >39.00)
LDL Cholesterol: 159 mg/dL — ABNORMAL HIGH (ref 0–99)
NonHDL: 173.24
Total CHOL/HDL Ratio: 4
Triglycerides: 69 mg/dL (ref 0.0–149.0)
VLDL: 13.8 mg/dL (ref 0.0–40.0)

## 2023-04-29 LAB — COMPREHENSIVE METABOLIC PANEL
ALT: 16 U/L (ref 0–35)
AST: 21 U/L (ref 0–37)
Albumin: 4.1 g/dL (ref 3.5–5.2)
Alkaline Phosphatase: 68 U/L (ref 39–117)
BUN: 12 mg/dL (ref 6–23)
CO2: 29 mEq/L (ref 19–32)
Calcium: 9.7 mg/dL (ref 8.4–10.5)
Chloride: 105 mEq/L (ref 96–112)
Creatinine, Ser: 0.85 mg/dL (ref 0.40–1.20)
GFR: 76.29 mL/min (ref >60.00)
Glucose, Bld: 82 mg/dL (ref 70–99)
Potassium: 3.8 mEq/L (ref 3.5–5.1)
Sodium: 141 mEq/L (ref 135–145)
Total Bilirubin: 0.4 mg/dL (ref 0.2–1.2)
Total Protein: 7.5 g/dL (ref 6.0–8.3)

## 2023-04-29 LAB — TSH: TSH: 0.64 u[IU]/mL (ref 0.35–5.50)

## 2023-04-29 LAB — HEMOGLOBIN A1C: Hgb A1c MFr Bld: 6.1 % (ref 4.6–6.5)

## 2023-04-29 NOTE — Progress Notes (Signed)
Established Patient Office Visit   Subjective  Patient ID: Rebecca Lindsey, female    DOB: [birth year]  Age: 57 y.o. MRN: [id number]  Chief Complaint  Patient presents with   Establish Care    Menopausal things     Patient is a 57 year old female who is seen for establish care and follow-up on chronic conditions.  Patient previously seen by Maurice Small, MD.  Last physical 05/27/2022.  HLD: Patient was on statin and fish oil which helped improve LDL cholesterol.  Patient states she has been out of the statin times months.  Does not recall the name of the medication.  Patient exercising regularly.  Works with a Psychologist, educational on Mondays.  Also cooking at home and avoiding processed foods when able.  Patient notes her son was recently diagnosed with HLD.  Hot flashes: Due to menopause.  Dealing with symptoms x 7 years.  Tried estrogen patches but they caused weight gain and she was concerned of cancer risk.  Gabapentin did not work.  Also tried OTC medications without improvement in symptoms.  Carries a fan with her.  Tries to avoid caffeine and cut down on sugar intake as that can make symptoms worse.  Allergies: NKDA     Patient Active Problem List   Diagnosis Date Noted   Fibroids, intramural 08/21/2015   Menorrhagia with irregular cycle    Fibroid, uterine    Past Surgical History:  Procedure Laterality Date   +-     CESAREAN SECTION     MYOMECTOMY  2004   TUBAL LIGATION     Social History   Tobacco Use   Smoking status: Never   Smokeless tobacco: Never  Substance Use Topics   Alcohol use: Yes    Alcohol/week: 0.0 standard drinks of alcohol    Comment: occasional glass of wine     Drug use: No   Family History  Problem Relation Age of Onset   Breast cancer Neg Hx    No Known Allergies    ROS Negative unless stated above    Objective:     BP 108/70 (BP Location: Right Arm, Patient Position: Sitting, Cuff Size: Large)   Pulse 76   Temp 98.5 F (36.9 C) (Oral)    Ht 5' 4.5" (1.638 m)   Wt 169 lb 9.6 oz (76.9 kg)   LMP 08/13/2015 (Approximate)   SpO2 96%   BMI 28.66 kg/m    Physical Exam Constitutional:      General: She is not in acute distress.    Appearance: Normal appearance.  HENT:     Head: Normocephalic and atraumatic.     Nose: Nose normal.     Mouth/Throat:     Mouth: Mucous membranes are moist.  Cardiovascular:     Rate and Rhythm: Normal rate and regular rhythm.     Heart sounds: Normal heart sounds. No murmur heard.    No gallop.  Pulmonary:     Effort: Pulmonary effort is normal. No respiratory distress.     Breath sounds: Normal breath sounds. No wheezing, rhonchi or rales.  Skin:    General: Skin is warm and dry.  Neurological:     Mental Status: She is alert and oriented to person, place, and time.         Assessment & Plan:  Mixed hyperlipidemia -Will obtain lipid panel.  For continued elevation restart statin. -Continue fish oil -     Lipid panel -     Lipoprotein A (LPA) -  Hemoglobin A1c -     Comprehensive metabolic panel  Hot flashes due to menopause -Discussed other possible medication options, that may not be covered by insurance. -Continue avoiding caffeine and sweets which may trigger symptoms. -     Hemoglobin A1c -     TSH -     CBC with Differential/Platelet  Encounter to establish care -We reviewed the PMH, PSH, FH, SH, Meds and Allergies. -We provided refills for any medications we will prescribe as needed. -We addressed current concerns per orders and patient instructions. -We have asked for records for pertinent exams, studies, vaccines and notes from previous providers. -We have advised patient to follow up per instructions below.   Return in about 5 weeks (around 06/03/2023) for physical.   Deeann Saint, MD

## 2023-05-02 LAB — LIPOPROTEIN A (LPA): Lipoprotein (a): 254 nmol/L — ABNORMAL HIGH

## 2023-05-04 ENCOUNTER — Other Ambulatory Visit: Payer: Self-pay | Admitting: Family Medicine

## 2023-05-15 LAB — HM MAMMOGRAPHY

## 2023-05-16 ENCOUNTER — Emergency Department (HOSPITAL_BASED_OUTPATIENT_CLINIC_OR_DEPARTMENT_OTHER): Payer: No Typology Code available for payment source | Admitting: Radiology

## 2023-05-16 ENCOUNTER — Encounter (HOSPITAL_BASED_OUTPATIENT_CLINIC_OR_DEPARTMENT_OTHER): Payer: Self-pay

## 2023-05-16 ENCOUNTER — Emergency Department (HOSPITAL_BASED_OUTPATIENT_CLINIC_OR_DEPARTMENT_OTHER)
Admission: EM | Admit: 2023-05-16 | Discharge: 2023-05-16 | Disposition: A | Discharge status: Home / Self Care | Payer: No Typology Code available for payment source | Attending: Emergency Medicine | Admitting: Emergency Medicine

## 2023-05-16 ENCOUNTER — Emergency Department (HOSPITAL_BASED_OUTPATIENT_CLINIC_OR_DEPARTMENT_OTHER): Payer: No Typology Code available for payment source

## 2023-05-16 DIAGNOSIS — S161XXA Strain of muscle, fascia and tendon at neck level, initial encounter: Secondary | ICD-10-CM | POA: Diagnosis not present

## 2023-05-16 DIAGNOSIS — Y92410 Unspecified street and highway as the place of occurrence of the external cause: Secondary | ICD-10-CM | POA: Diagnosis not present

## 2023-05-16 DIAGNOSIS — M546 Pain in thoracic spine: Secondary | ICD-10-CM | POA: Insufficient documentation

## 2023-05-16 DIAGNOSIS — S199XXA Unspecified injury of neck, initial encounter: Secondary | ICD-10-CM | POA: Diagnosis present

## 2023-05-16 DIAGNOSIS — S63501A Unspecified sprain of right wrist, initial encounter: Secondary | ICD-10-CM | POA: Diagnosis not present

## 2023-05-16 MED ORDER — METHOCARBAMOL 500 MG PO TABS: 500.0000 mg | ORAL_TABLET | Freq: Three times a day (TID) | ORAL | 0 refills | Status: DC | PRN

## 2023-05-16 MED ORDER — METHOCARBAMOL 500 MG PO TABS
500.0000 mg | ORAL_TABLET | Freq: Once | ORAL | Status: AC
  Administered 2023-05-16: 500 mg via ORAL
  Filled 2023-05-16: qty 1
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
## 2023-07-30 ENCOUNTER — Encounter: Payer: Self-pay | Admitting: Family Medicine

## 2023-07-30 ENCOUNTER — Ambulatory Visit (INDEPENDENT_AMBULATORY_CARE_PROVIDER_SITE_OTHER): Payer: No Typology Code available for payment source | Admitting: Family Medicine

## 2023-07-30 VITALS — BP 120/70 | HR 69 | Temp 98.4°F | Ht 64.0 in | Wt 168.4 lb

## 2023-07-30 DIAGNOSIS — Z1159 Encounter for screening for other viral diseases: Secondary | ICD-10-CM

## 2023-07-30 DIAGNOSIS — N951 Menopausal and female climacteric states: Secondary | ICD-10-CM

## 2023-07-30 DIAGNOSIS — H43393 Other vitreous opacities, bilateral: Secondary | ICD-10-CM | POA: Diagnosis not present

## 2023-07-30 DIAGNOSIS — E782 Mixed hyperlipidemia: Secondary | ICD-10-CM | POA: Diagnosis not present

## 2023-07-30 DIAGNOSIS — Z0000 Encounter for general adult medical examination without abnormal findings: Secondary | ICD-10-CM

## 2023-07-30 LAB — COMPREHENSIVE METABOLIC PANEL
ALT: 14 U/L (ref 0–35)
AST: 17 U/L (ref 0–37)
Albumin: 4.3 g/dL (ref 3.5–5.2)
Alkaline Phosphatase: 75 U/L (ref 39–117)
BUN: 12 mg/dL (ref 6–23)
CO2: 29 meq/L (ref 19–32)
Calcium: 9.7 mg/dL (ref 8.4–10.5)
Chloride: 105 meq/L (ref 96–112)
Creatinine, Ser: 0.92 mg/dL (ref 0.40–1.20)
GFR: 69.26 mL/min (ref >60.00)
Glucose, Bld: 82 mg/dL (ref 70–99)
Potassium: 3.9 meq/L (ref 3.5–5.1)
Sodium: 140 meq/L (ref 135–145)
Total Bilirubin: 0.4 mg/dL (ref 0.2–1.2)
Total Protein: 7.7 g/dL (ref 6.0–8.3)

## 2023-07-30 LAB — LIPID PANEL
Cholesterol: 178 mg/dL (ref 0–200)
HDL: 70.3 mg/dL (ref >39.00)
LDL Cholesterol: 95 mg/dL (ref 0–99)
NonHDL: 107.77
Total CHOL/HDL Ratio: 3
Triglycerides: 63 mg/dL (ref 0.0–149.0)
VLDL: 12.6 mg/dL (ref 0.0–40.0)

## 2023-07-30 MED ORDER — VEOZAH 45 MG PO TABS
45.0000 mg | ORAL_TABLET | Freq: Every day | ORAL | 2 refills | Status: DC
Start: 2023-07-30 — End: 2023-10-07

## 2023-07-30 NOTE — Progress Notes (Signed)
Established Patient Office Visit   Subjective  Patient ID: Rebecca Lindsey, female    DOB: 1965-12-02  Age: 57 y.o. MRN: [id number]  Chief Complaint  Patient presents with   Annual Exam    Pt is a 57 yo female seen for CPE.  Patient has been doing well overall.  Started taking atorvastatin 20 mg daily for cholesterol.  Denies myalgias or arthralgias since starting medication.  Patient still having hot flashes.  Has tried OTC meds and gabapentin without relief.  No longer having night sweats but gets hot during the night.  Patient open to medications but does not like taking medications.  States will consider immunizations.  Patient Endor seeing occasional floaters in vision.  In the past had laser surgery for both eyes for retinal detachment.  Has not had an eye exam in the last 2 years.  Wearing readers when working on computer throughout the day.  Does not have bluelight blocking filters.    Patient Active Problem List   Diagnosis Date Noted   Mixed hyperlipidemia 04/29/2023   Hot flashes due to menopause 04/29/2023   Fibroids, intramural 08/21/2015   Menorrhagia with irregular cycle    Fibroid, uterine    Past Medical History:  Diagnosis Date   Anemia    Fibroid    Past Surgical History:  Procedure Laterality Date   +-     CESAREAN SECTION     MYOMECTOMY  2004   TUBAL LIGATION     Social History   Tobacco Use   Smoking status: Never   Smokeless tobacco: Never  Substance Use Topics   Alcohol use: Yes    Alcohol/week: 0.0 standard drinks of alcohol    Comment: occasional glass of wine     Drug use: No   Family History  Problem Relation Age of Onset   Breast cancer Neg Hx    No Known Allergies    ROS Negative unless stated above    Objective:     BP 120/70 (BP Location: Right Arm, Patient Position: Sitting, Cuff Size: Normal)   Pulse 69   Temp 98.4 F (36.9 C) (Oral)   Ht 5\' 4"  (1.626 m)   Wt 168 lb 6.4 oz (76.4 kg)   LMP 08/13/2015  (Approximate)   SpO2 97%   BMI 28.91 kg/m  BP Readings from Last 3 Encounters:  07/30/23 120/70  05/16/23 116/70  04/29/23 108/70   Wt Readings from Last 3 Encounters:  07/30/23 168 lb 6.4 oz (76.4 kg)  05/16/23 169 lb 9.6 oz (76.9 kg)  04/29/23 169 lb 9.6 oz (76.9 kg)      Physical Exam Constitutional:      Appearance: Normal appearance.  HENT:     Head: Normocephalic and atraumatic.     Right Ear: Tympanic membrane, ear canal and external ear normal.     Left Ear: Tympanic membrane, ear canal and external ear normal.     Nose: Nose normal.     Mouth/Throat:     Mouth: Mucous membranes are moist.     Pharynx: No oropharyngeal exudate or posterior oropharyngeal erythema.  Eyes:     General: No scleral icterus.    Extraocular Movements: Extraocular movements intact.     Conjunctiva/sclera: Conjunctivae normal.     Pupils: Pupils are equal, round, and reactive to light.  Neck:     Thyroid: No thyromegaly.  Cardiovascular:     Rate and Rhythm: Normal rate and regular rhythm.     Pulses:  Normal pulses.     Heart sounds: Normal heart sounds. No murmur heard.    No friction rub.  Pulmonary:     Effort: Pulmonary effort is normal.     Breath sounds: Normal breath sounds. No wheezing, rhonchi or rales.  Abdominal:     General: Bowel sounds are normal.     Palpations: Abdomen is soft.     Tenderness: There is no abdominal tenderness.  Musculoskeletal:        General: No deformity. Normal range of motion.  Lymphadenopathy:     Cervical: No cervical adenopathy.  Skin:    General: Skin is warm and dry.     Findings: No lesion.  Neurological:     General: No focal deficit present.     Mental Status: She is alert and oriented to person, place, and time.  Psychiatric:        Mood and Affect: Mood normal.        Thought Content: Thought content normal.      No results found for any visits on 07/30/23.    Assessment & Plan:  Well adult exam -Age-appropriate health  screenings discussed -Will obtain labs.  Recent labs from 04/29/2023 reviewed and not repeated unless indicated. -Mammogram done 05/15/2023 -Colonoscopy done 01/07/2019. -Pap done 05/27/2022 -Immunizations reviewed.  Patient to consider shingles.  Declines influenza at this time.  Tdap up-to-date done 05/27/2021. -Next CPE in 1 year  Mixed hyperlipidemia -Continue Lipitor 20 mg daily -     Lipid panel -     Comprehensive metabolic panel  Hot flashes due to menopause -Discussed other medication options.  Will try Veozah but may require PA as is a newer agent.  Monitor liver function while on medication. Allyne Gee; Take 1 tablet (45 mg total) by mouth daily.  Dispense: 30 tablet; Refill: 2 -     Comprehensive metabolic panel  Encounter for hepatitis C screening test for low risk patient -     Hepatitis C antibody  Floaters in visual field, bilateral -Discussed setting up eye exam with ophthalmologist  Return in about 2 months (around 09/29/2023), or if symptoms worsen or fail to improve.   Deeann Saint, MD

## 2023-07-31 LAB — HEPATITIS C ANTIBODY: Hepatitis C Ab: NONREACTIVE

## 2023-08-13 ENCOUNTER — Other Ambulatory Visit (HOSPITAL_COMMUNITY): Payer: Self-pay

## 2023-08-13 ENCOUNTER — Telehealth: Payer: Self-pay | Admitting: Pharmacy Technician

## 2023-08-13 NOTE — Telephone Encounter (Signed)
Pharmacy Patient Advocate Encounter   Received notification from CoverMyMeds that prior authorization for Veozah 45MG  tablets is required/requested.   Insurance verification completed.   The patient is insured through [employer] .   Per test claim: PA required; PA submitted to [facility] via CoverMyMeds Key/confirmation #/EOC [id number] Status is pending

## 2023-09-12 ENCOUNTER — Other Ambulatory Visit: Payer: Self-pay | Admitting: Family Medicine

## 2023-10-05 ENCOUNTER — Other Ambulatory Visit: Payer: Self-pay

## 2023-10-05 ENCOUNTER — Ambulatory Visit (HOSPITAL_COMMUNITY)
Admission: EM | Admit: 2023-10-05 | Discharge: 2023-10-05 | Disposition: A | Discharge status: Home / Self Care | Payer: No Typology Code available for payment source | Attending: Emergency Medicine | Admitting: Emergency Medicine

## 2023-10-05 ENCOUNTER — Encounter (HOSPITAL_COMMUNITY): Payer: Self-pay | Admitting: *Deleted

## 2023-10-05 DIAGNOSIS — R058 Other specified cough: Secondary | ICD-10-CM

## 2023-10-05 MED ORDER — PROMETHAZINE-DM 6.25-15 MG/5ML PO SYRP: 5.0000 mL | ORAL_SOLUTION | Freq: Four times a day (QID) | ORAL | 0 refills | Status: DC | PRN

## 2023-10-05 MED ORDER — PREDNISONE 20 MG PO TABS
40.0000 mg | ORAL_TABLET | Freq: Once | ORAL | Status: AC
  Administered 2023-10-05: 40 mg via ORAL

## 2023-10-05 MED ORDER — PREDNISONE 20 MG PO TABS
ORAL_TABLET | ORAL | Status: AC
  Filled 2023-10-05: qty 2

## 2023-10-05 NOTE — ED Provider Notes (Signed)
[facility]    CSN: [id number] Arrival date & time: 10/05/23  1610      History   Chief Complaint Chief Complaint  Patient presents with   Cough    HPI Rebecca Lindsey is a 57 y.o. female.   Patient presents to clinic for complaints of a sore throat and a cough.  Her symptoms started approximately 6 days prior.  Reports fever on Wednesday and Thursday, no fever since.  No fatigue.  Cough is nonproductive.  She has not had any congestion.  No vomiting or diarrhea.  She has tried multiple over-the-counter methods and treatments without much improvement.  The history is provided by the patient and medical records.  Cough   Past Medical History:  Diagnosis Date   Anemia    Fibroid     Patient Active Problem List   Diagnosis Date Noted   Mixed hyperlipidemia 04/29/2023   Hot flashes due to menopause 04/29/2023   Fibroids, intramural 08/21/2015   Menorrhagia with irregular cycle    Fibroid, uterine     Past Surgical History:  Procedure Laterality Date   +-     CESAREAN SECTION     MYOMECTOMY  2004   TUBAL LIGATION      OB History   No obstetric history on file.      Home Medications    Prior to Admission medications   Medication Sig Start Date End Date Taking? Authorizing Provider  promethazine-dextromethorphan (PROMETHAZINE-DM) 6.25-15 MG/5ML syrup Take 5 mLs by mouth 4 (four) times daily as needed for cough. 10/05/23  Yes Rinaldo Ratel, [place] N, FNP  atorvastatin (LIPITOR) 20 MG tablet TAKE 1 TABLET BY MOUTH EVERY DAY 09/14/23   Deeann Saint, MD  Fezolinetant (VEOZAH) 45 MG TABS Take 1 tablet (45 mg total) by mouth daily. 07/30/23   Deeann Saint, MD  iron polysaccharides (NU-IRON) 150 MG capsule Take 150 mg by mouth 2 (two) times daily.    [provider]  methocarbamol (ROBAXIN) 500 MG tablet Take 1 tablet (500 mg total) by mouth every 8 (eight) hours as needed. 05/16/23   Benjiman Core, MD  Multiple Vitamin (MULTIVITAMIN  WITH MINERALS) TABS tablet Take 1 tablet by mouth daily.    [provider]  nitrofurantoin, macrocrystal-monohydrate, (MACROBID) 100 MG capsule Take 100 mg by mouth 2 (two) times daily. 07/18/23   [provider]  Pediatric Multivitamins-Fl (MULTIVITAMIN + FLUORIDE) 0.25 MG CHEW 1 tablet by mouth Once daily    [provider]    Family History Family History  Problem Relation Age of Onset   Breast cancer Neg Hx     Social History Social History   Tobacco Use   Smoking status: Never   Smokeless tobacco: Never  Substance Use Topics   Alcohol use: Yes    Alcohol/week: 0.0 standard drinks of alcohol    Comment: occasional glass of wine     Drug use: No     Allergies   Patient has no known allergies.   Review of Systems Review of Systems  Per HPI   Physical Exam Triage Vital Signs ED Triage Vitals  Encounter Vitals Group     BP 10/05/23 1805 (!) 143/86     Systolic BP Percentile --      Diastolic BP Percentile --      Pulse Rate 10/05/23 1805 73     Resp 10/05/23 1805 20     Temp 10/05/23 1805 98.3 F (36.8 C)  Temp src --      SpO2 10/05/23 1805 98 %     Weight --      Height --      Head Circumference --      Peak Flow --      Pain Score 10/05/23 1802 0     Pain Loc --      Pain Education --      Exclude from Growth Chart --    No data found.  Updated Vital Signs BP (!) 143/86   Pulse 73   Temp 98.3 F (36.8 C)   Resp 20   LMP 08/13/2015 (Approximate)   SpO2 98%   Visual Acuity Right Eye Distance:   Left Eye Distance:   Bilateral Distance:    Right Eye Near:   Left Eye Near:    Bilateral Near:     Physical Exam Vitals and nursing note reviewed.  Constitutional:      Appearance: Normal appearance.  HENT:     Head: Normocephalic and atraumatic.     Right Ear: External ear normal.     Left Ear: External ear normal.     Nose: Nose normal.     Mouth/Throat:     Mouth: Mucous membranes are moist.     Pharynx:  Posterior oropharyngeal erythema present.  Eyes:     Conjunctiva/sclera: Conjunctivae normal.  Cardiovascular:     Rate and Rhythm: Normal rate and regular rhythm.     Heart sounds: Normal heart sounds. No murmur heard. Pulmonary:     Effort: Pulmonary effort is normal.     Breath sounds: Normal breath sounds.  Musculoskeletal:        General: Normal range of motion.  Lymphadenopathy:     Cervical: No cervical adenopathy.  Skin:    General: Skin is warm and dry.  Neurological:     General: No focal deficit present.     Mental Status: She is alert.  Psychiatric:        Mood and Affect: Mood normal.      UC Treatments / Results  Labs (all labs ordered are listed, but only abnormal results are displayed) Labs Reviewed - No data to display  EKG   Radiology No results found.  Procedures Procedures (including critical care time)  Medications Ordered in UC Medications  predniSONE (DELTASONE) tablet 40 mg (has no administration in time range)    Initial Impression / Assessment and Plan / UC Course  I have reviewed the triage vital signs and the nursing notes.  Pertinent labs & imaging results that were available during my care of the patient were reviewed by me and considered in my medical decision making (see chart for details).  Vitals and triage reviewed, patient is hemodynamically stable.  Lungs are vesicular, heart with regular rate and rhythm.  Posterior pharynx with erythema.  No fatigue or fevers.  Suspect postviral cough, symptomatic management reviewed.  Will give one-time steroid dose in clinic to help with pain and inflammation.  Plan of care, follow-up care return precautions given, no questions at this time.     Final Clinical Impressions(s) / UC Diagnoses   Final diagnoses:  Post-viral cough syndrome     Discharge Instructions      We have given you a one-time steroid dose in clinic to help with your cough and inflammation.  For your sore throat  you can sleep with a humidifier, do warm saline gargles, drink tea with honey and cold foods like popsicles  may help.  You can take the cough medicine up to 4 times daily, do not drink or drive on this medication as it may cause drowsiness.  Unfortunately this postviral cough can linger for up to 6 weeks.  Please follow-up with your primary care provider or return to clinic if no improvement or any changes.     ED Prescriptions     Medication Sig Dispense Auth. Provider   promethazine-dextromethorphan (PROMETHAZINE-DM) 6.25-15 MG/5ML syrup Take 5 mLs by mouth 4 (four) times daily as needed for cough. 118 mL Latanya Hemmer, [place], [place]      PDMP not reviewed this encounter.   Lisandro Meggett, [place] N, [place] 10/05/23 [id number]

## 2023-10-05 NOTE — Discharge Instructions (Addendum)
We have given you a one-time steroid dose in clinic to help with your cough and inflammation.  For your sore throat you can sleep with a humidifier, do warm saline gargles, drink tea with honey and cold foods like popsicles may help.  You can take the cough medicine up to 4 times daily, do not drink or drive on this medication as it may cause drowsiness.  Unfortunately this postviral cough can linger for up to 6 weeks.  Please follow-up with your primary care provider or return to clinic if no improvement or any changes.

## 2023-10-05 NOTE — ED Triage Notes (Signed)
Pt reports having a cough that causes her throat to close up and Pt has hard time breathing. Pt also reports her throat is raw from coughing. The cough is worse at night.

## 2023-10-06 ENCOUNTER — Telehealth: Payer: Self-pay

## 2023-10-06 NOTE — Telephone Encounter (Signed)
Spoke with patient to follow-up with triage call 12/9, patient has been seen at [facility] for cough and congestion, sore throat, trouble breathing, patient states she is doing feeling a little better, patient denies any trouble breathing and Blueness around the lips at this time, sch patient to see Dr. Salomon Fick 12/11 at 4:30

## 2023-10-07 ENCOUNTER — Encounter: Payer: Self-pay | Admitting: Family Medicine

## 2023-10-07 ENCOUNTER — Ambulatory Visit: Payer: No Typology Code available for payment source | Admitting: Family Medicine

## 2023-10-07 VITALS — BP 100/74 | HR 81 | Temp 98.9°F | Ht 64.0 in | Wt 177.0 lb

## 2023-10-07 DIAGNOSIS — J392 Other diseases of pharynx: Secondary | ICD-10-CM

## 2023-10-07 DIAGNOSIS — J069 Acute upper respiratory infection, unspecified: Secondary | ICD-10-CM

## 2023-10-07 LAB — POCT RAPID STREP A (OFFICE): Rapid Strep A Screen: NEGATIVE

## 2023-10-07 MED ORDER — FLUTICASONE PROPIONATE 50 MCG/ACT NA SUSP
1.0000 | Freq: Every day | NASAL | 0 refills | Status: AC
Start: 2023-10-07 — End: ?

## 2023-10-07 NOTE — Progress Notes (Signed)
Established Patient Office Visit   Subjective  Patient ID: Rebecca Lindsey, female    DOB: 03-25-1966  Age: 57 y.o. MRN: [id number]  Chief Complaint  Patient presents with   Cough    Cough congestion, sore throat, started a week ago     Pt is a 57 yo female seen for ongoing concern.  Pt with cough, congestion, sore throat x 1 week.  Symptoms started as fatigue and progressed into fever, dry throat, coughing spells, headache, and 1 day of nausea.  Patient denies rhinorrhea, congestion, facial pain/pressure, ear pain/pressure.  Seen at [facility] on 12/9, given promethazine cough syrup and p.o. steroids in clinic for symptoms.  Cough is a lot better today than it has been.  Patient tried Mucinex multisymptom, Alka-Seltzer, Robitussin, Delsym, cough drops for symptoms.  Currently alternating drinking hot and cold liquids.    Patient Active Problem List   Diagnosis Date Noted   Mixed hyperlipidemia 04/29/2023   Hot flashes due to menopause 04/29/2023   Fibroids, intramural 08/21/2015   Menorrhagia with irregular cycle    Fibroid, uterine    Past Medical History:  Diagnosis Date   Anemia    Fibroid    Past Surgical History:  Procedure Laterality Date   +-     CESAREAN SECTION     MYOMECTOMY  2004   TUBAL LIGATION     Social History   Tobacco Use   Smoking status: Never   Smokeless tobacco: Never  Substance Use Topics   Alcohol use: Yes    Alcohol/week: 0.0 standard drinks of alcohol    Comment: occasional glass of wine     Drug use: No   Family History  Problem Relation Age of Onset   Breast cancer Neg Hx    No Known Allergies    ROS Negative unless stated above    Objective:     BP 100/74 (BP Location: Left Arm, Patient Position: Sitting, Cuff Size: Normal)   Pulse 81   Temp 98.9 F (37.2 C) (Oral)   Ht 5\' 4"  (1.626 m)   Wt 177 lb (80.3 kg)   LMP 08/13/2015 (Approximate)   SpO2 96%   BMI 30.38 kg/m  BP Readings from Last 3 Encounters:  10/07/23 100/74   10/05/23 (!) 143/86  07/30/23 120/70   Wt Readings from Last 3 Encounters:  10/07/23 177 lb (80.3 kg)  07/30/23 168 lb 6.4 oz (76.4 kg)  05/16/23 169 lb 9.6 oz (76.9 kg)      Physical Exam Constitutional:      General: She is not in acute distress.    Appearance: Normal appearance.  HENT:     Head: Normocephalic and atraumatic.     Ears:     Comments: Bilateral TMs full.    Nose: Mucosal edema and congestion present.     Right Turbinates: Swollen.     Left Turbinates: Swollen.     Right Sinus: No maxillary sinus tenderness or frontal sinus tenderness.     Left Sinus: No maxillary sinus tenderness or frontal sinus tenderness.     Mouth/Throat:     Mouth: Mucous membranes are moist.  Cardiovascular:     Rate and Rhythm: Normal rate and regular rhythm.     Heart sounds: Normal heart sounds. No murmur heard.    No gallop.  Pulmonary:     Effort: Pulmonary effort is normal. No respiratory distress.     Breath sounds: Normal breath sounds. No wheezing, rhonchi or rales.  Comments: Dry cough with slightly productive sound. Skin:    General: Skin is warm and dry.  Neurological:     Mental Status: She is alert and oriented to person, place, and time.    Results for orders placed or performed in visit on 10/07/23  POC Rapid Strep A  Result Value Ref Range   Rapid Strep A Screen Negative Negative      Assessment & Plan:  Throat irritation -     POCT rapid strep A  Viral URI with cough -     Fluticasone Propionate; Place 1 spray into both nostrils daily.  Dispense: 16 g; Refill: 0  Acute URI symptoms likely 2/2 viral etiology.  POC strep testing negative.  Rx for Flonase sent to pharmacy.  Continue supportive care with OTC cough/cold medications.  Given strict precautions.  Return if symptoms worsen or fail to improve.   Deeann Saint, MD

## 2023-10-07 NOTE — Patient Instructions (Signed)
Prescription for Flonase was sent to your pharmacy.  This is a nasal spray that can help with the postnasal drainage and the inflammation in your nose.

## 2023-10-08 ENCOUNTER — Other Ambulatory Visit: Payer: Self-pay | Admitting: Family Medicine

## 2023-10-08 DIAGNOSIS — J069 Acute upper respiratory infection, unspecified: Secondary | ICD-10-CM

## 2023-10-09 NOTE — Telephone Encounter (Signed)
Pharmacy Patient Advocate Encounter  Received notification from [facility] that Prior Authorization for Veozah 45MG  tablets  has been APPROVED from 08/13/2023 to 08/12/2024   PA #/Case ID/Reference #: [id number]

## 2024-07-04 ENCOUNTER — Encounter: Payer: Self-pay | Admitting: Family Medicine

## 2024-07-04 ENCOUNTER — Ambulatory Visit: Admitting: Family Medicine

## 2024-07-04 VITALS — BP 108/64 | HR 70 | Temp 98.7°F | Ht 64.0 in | Wt 174.4 lb

## 2024-07-04 DIAGNOSIS — J392 Other diseases of pharynx: Secondary | ICD-10-CM

## 2024-07-04 DIAGNOSIS — R300 Dysuria: Secondary | ICD-10-CM

## 2024-07-04 DIAGNOSIS — N951 Menopausal and female climacteric states: Secondary | ICD-10-CM | POA: Diagnosis not present

## 2024-07-04 DIAGNOSIS — R3129 Other microscopic hematuria: Secondary | ICD-10-CM

## 2024-07-04 DIAGNOSIS — Z0001 Encounter for general adult medical examination with abnormal findings: Secondary | ICD-10-CM

## 2024-07-04 LAB — POC URINALSYSI DIPSTICK (AUTOMATED)
Bilirubin, UA: NEGATIVE
Blood, UA: POSITIVE
Glucose, UA: NEGATIVE
Ketones, UA: NEGATIVE
Leukocytes, UA: NEGATIVE
Nitrite, UA: NEGATIVE
Protein, UA: NEGATIVE
Spec Grav, UA: 1.015 (ref 1.010–1.025)
Urobilinogen, UA: 0.2 U/dL
pH, UA: 6 (ref 5.0–8.0)

## 2024-07-04 LAB — LIPID PANEL
Cholesterol: 259 mg/dL — ABNORMAL HIGH (ref 0–200)
HDL: 63.2 mg/dL (ref >39.00)
LDL Cholesterol: 173 mg/dL — ABNORMAL HIGH (ref 0–99)
NonHDL: 195.35
Total CHOL/HDL Ratio: 4
Triglycerides: 113 mg/dL (ref 0.0–149.0)
VLDL: 22.6 mg/dL (ref 0.0–40.0)

## 2024-07-04 LAB — URINALYSIS, ROUTINE W REFLEX MICROSCOPIC
Bilirubin Urine: NEGATIVE
Ketones, ur: NEGATIVE
Leukocytes,Ua: NEGATIVE
Nitrite: NEGATIVE
Specific Gravity, Urine: 1.01 (ref 1.000–1.030)
Total Protein, Urine: NEGATIVE
Urine Glucose: NEGATIVE
Urobilinogen, UA: 0.2 (ref 0.0–1.0)
pH: 6.5 (ref 5.0–8.0)

## 2024-07-04 LAB — CBC WITH DIFFERENTIAL/PLATELET
Basophils Absolute: 0 K/uL (ref 0.0–0.1)
Basophils Relative: 0.6 % (ref 0.0–3.0)
Eosinophils Absolute: 0 K/uL (ref 0.0–0.7)
Eosinophils Relative: 0.9 % (ref 0.0–5.0)
HCT: 38.7 % (ref 36.0–46.0)
Hemoglobin: 12.7 g/dL (ref 12.0–15.0)
Lymphocytes Relative: 42.4 % (ref 12.0–46.0)
Lymphs Abs: 2 K/uL (ref 0.7–4.0)
MCHC: 32.7 g/dL (ref 30.0–36.0)
MCV: 87.7 fl (ref 78.0–100.0)
Monocytes Absolute: 0.5 K/uL (ref 0.1–1.0)
Monocytes Relative: 9.4 % (ref 3.0–12.0)
Neutro Abs: 2.3 K/uL (ref 1.4–7.7)
Neutrophils Relative %: 46.7 % (ref 43.0–77.0)
Platelets: 212 K/uL (ref 150.0–400.0)
RBC: 4.42 Mil/uL (ref 3.87–5.11)
RDW: 14.2 % (ref 11.5–15.5)
WBC: 4.8 K/uL (ref 4.0–10.5)

## 2024-07-04 LAB — COMPREHENSIVE METABOLIC PANEL WITH GFR
ALT: 18 U/L (ref 0–35)
AST: 19 U/L (ref 0–37)
Albumin: 4.3 g/dL (ref 3.5–5.2)
Alkaline Phosphatase: 79 U/L (ref 39–117)
BUN: 12 mg/dL (ref 6–23)
CO2: 31 meq/L (ref 19–32)
Calcium: 9.9 mg/dL (ref 8.4–10.5)
Chloride: 103 meq/L (ref 96–112)
Creatinine, Ser: 0.86 mg/dL (ref 0.40–1.20)
GFR: 74.61 mL/min (ref >60.00)
Glucose, Bld: 75 mg/dL (ref 70–99)
Potassium: 4.1 meq/L (ref 3.5–5.1)
Sodium: 140 meq/L (ref 135–145)
Total Bilirubin: 0.4 mg/dL (ref 0.2–1.2)
Total Protein: 7.8 g/dL (ref 6.0–8.3)

## 2024-07-04 LAB — TSH: TSH: 0.65 u[IU]/mL (ref 0.35–5.50)

## 2024-07-04 LAB — HEMOGLOBIN A1C: Hgb A1c MFr Bld: 6.5 % (ref 4.6–6.5)

## 2024-07-04 LAB — T4, FREE: Free T4: 0.78 ng/dL (ref 0.60–1.60)

## 2024-07-04 NOTE — Progress Notes (Signed)
 Established Patient Office Visit   Subjective  Patient ID: Rebecca Lindsey, female    DOB: Mar 04, 1966  Age: 58 y.o. MRN: [id number]  Chief Complaint  Patient presents with   Annual Exam    And pap    Pt is a 58 yo female seen for CPE and several concerns.  Pt with sensation of dry throat and needing to clear throat x yrs.  Worse at night and in the winter.  At times cough/dryness wakes patient up at night.  Denies heartburn, changes in meds or supplements.  Denies dry eyes/dry mouth.  Does not feel like has obvious allergy symptoms.  Patient concerned about UTI.  Endorses running sensation x 1 week.  Denies odor, dysuria, constipation, back pain, nausea, vomiting.  States recent episode possibly caused by holding urine 1 week ago.  Notes having similar symptoms several times this year.  Some improvement noted since starting cranberry juice this week.  Patient endorses continued hot flashes due to menopause.  Insurance would not cover Veozah .  Tried other rx meds without improvement.  Pt's mother is deceased so she is unsure when she had menopause.  Tried OTC medications.  In the past tried estrogen patches which help with symptoms but caused weight gain. Was also concerned about potential cancer risk.  Patient has been off the patch for years but notes continued weight gain.  Previously 140s lbs.  Last Pap 2023.  Patient denies recent abnormal Paps.  States may have had an abnormal Pap over 20 years ago or more.    Patient Active Problem List   Diagnosis Date Noted   Mixed hyperlipidemia 04/29/2023   Hot flashes due to menopause 04/29/2023   Fibroids, intramural 08/21/2015   Menorrhagia with irregular cycle    Fibroid, uterine    Past Medical History:  Diagnosis Date   Anemia    Fibroid    Past Surgical History:  Procedure Laterality Date   +-     CESAREAN SECTION     MYOMECTOMY  2004   TUBAL LIGATION     Social History   Tobacco Use   Smoking status: Never    Smokeless tobacco: Never  Substance Use Topics   Alcohol use: Yes    Alcohol/week: 0.0 standard drinks of alcohol    Comment: occasional glass of wine     Drug use: No   Family History  Problem Relation Age of Onset   Breast cancer Neg Hx    No Known Allergies  ROS Negative unless stated above    Objective:     BP 108/64 (BP Location: Left Arm, Patient Position: Sitting, Cuff Size: Normal)   Pulse 70   Temp 98.7 F (37.1 C) (Oral)   Ht 5' 4 (1.626 m)   Wt 174 lb 6.4 oz (79.1 kg)   LMP 08/13/2015 (Approximate)   SpO2 98%   BMI 29.94 kg/m  BP Readings from Last 3 Encounters:  07/04/24 108/64  10/07/23 100/74  10/05/23 (!) 143/86   Wt Readings from Last 3 Encounters:  07/04/24 174 lb 6.4 oz (79.1 kg)  10/07/23 177 lb (80.3 kg)  07/30/23 168 lb 6.4 oz (76.4 kg)      Physical Exam Constitutional:      Appearance: Normal appearance.  HENT:     Head: Normocephalic and atraumatic.     Right Ear: Tympanic membrane, ear canal and external ear normal.     Left Ear: Tympanic membrane, ear canal and external ear normal.  Nose: Nose normal.     Mouth/Throat:     Mouth: Mucous membranes are moist.     Pharynx: No oropharyngeal exudate or posterior oropharyngeal erythema.  Eyes:     General: No scleral icterus.    Extraocular Movements: Extraocular movements intact.     Conjunctiva/sclera: Conjunctivae normal.     Pupils: Pupils are equal, round, and reactive to light.  Neck:     Thyroid: No thyromegaly.     Vascular: No carotid bruit.  Cardiovascular:     Rate and Rhythm: Normal rate and regular rhythm.     Pulses: Normal pulses.     Heart sounds: Normal heart sounds. No murmur heard.    No friction rub.  Pulmonary:     Effort: Pulmonary effort is normal.     Breath sounds: Normal breath sounds. No wheezing, rhonchi or rales.  Abdominal:     General: Bowel sounds are normal.     Palpations: Abdomen is soft.     Tenderness: There is no abdominal tenderness.   Musculoskeletal:        General: No deformity. Normal range of motion.  Lymphadenopathy:     Cervical: No cervical adenopathy.  Skin:    General: Skin is warm and dry.     Findings: No lesion.  Neurological:     General: No focal deficit present.     Mental Status: She is alert and oriented to person, place, and time.  Psychiatric:        Mood and Affect: Mood normal.        Thought Content: Thought content normal.        07/04/2024   11:57 AM 10/07/2023    4:47 PM 07/30/2023    9:55 AM  Depression screen PHQ 2/9  Decreased Interest 1 0 0  Down, Depressed, Hopeless 0 0 0  PHQ - 2 Score 1 0 0  Altered sleeping 0 0 0  Tired, decreased energy 1 0 1  Change in appetite 0 0 0  Feeling bad or failure about yourself  0 0 0  Trouble concentrating 0 0 0  Moving slowly or fidgety/restless 0 0 0  Suicidal thoughts 0 0 0  PHQ-9 Score 2 0 1  Difficult doing work/chores Not difficult at all Not difficult at all Not difficult at all      07/04/2024   11:57 AM 10/07/2023    4:47 PM 07/30/2023    9:55 AM 04/29/2023    9:59 AM  GAD 7 : Generalized Anxiety Score  Nervous, Anxious, on Edge 0 0 0 0  Control/stop worrying 0 0 0 0  Worry too much - different things 0 0 0 0  Trouble relaxing 0 0 0 0  Restless 0 0 0 0  Easily annoyed or irritable 1 0 0 1  Afraid - awful might happen 0 0 0 0  Total GAD 7 Score 1 0 0 1  Anxiety Difficulty Not difficult at all Not difficult at all Not difficult at all Not difficult at all     Results for orders placed or performed in visit on 07/04/24  POCT Urinalysis Dipstick (Automated)  Result Value Ref Range   Color, UA yellow    Clarity, UA clear    Glucose, UA Negative Negative   Bilirubin, UA neg    Ketones, UA neg    Spec Grav, UA 1.015 1.010 - 1.025   Blood, UA pos    pH, UA 6.0 5.0 - 8.0   Protein,  UA Negative Negative   Urobilinogen, UA 0.2 0.2 or 1.0 E.U./dL   Nitrite, UA neg    Leukocytes, UA Negative Negative      Assessment &  Plan:   Encounter for well adult exam with abnormal findings -     Comprehensive metabolic panel with GFR -     CBC with Differential/Platelet; Future -     TSH -     T4, free; Future -     Hemoglobin A1c; Future -     Lipid panel; Future  Dysuria -     POCT Urinalysis Dipstick (Automated) -     Urinalysis, Routine w reflex microscopic  Hot flashes due to menopause  Throat irritation -     Sjogrens syndrome-A extractable nuclear antibody -     Sjogrens syndrome-B extractable nuclear antibody  Other microscopic hematuria -     Urinalysis, Routine w reflex microscopic  Age-appropriate health screenings discussed.  Obtain labs.  Immunizations reviewed.  Pap last done 05/27/2022.  Patient to schedule mammogram.  Colonoscopy done 01/07/2019.  Hot flashes, wt gain, vaginal irritation likely from decreased estrogen d/t menopause.  POC UA with 3+ RBCs, SG 1.015, no leuks.  Obtain Urine micro.  Renal calculi possible.  Screening for Sjogren's for throat irritation.  Advised silent reflux or allergies may also be contributing.  Trial of PPI then antihistamine.    Return in 6 weeks (on 08/15/2024) for for continued symptom..   F/u in 6-8 wks if needed.  Clotilda JONELLE Single, MD

## 2024-07-04 NOTE — Patient Instructions (Signed)
 You can try over the counter omeprazole, a medicine for acid for the dryness of your throat.  We will also check autoimmune testing for Sjogren's dz. to make sure it is not causing your symptoms.  Your urine was negative for UTI.  There was some blood noted in the sample.  We will send it to the lab to further evaluate if it is being caused by kidney stones, or infection.

## 2024-07-05 LAB — SJOGRENS SYNDROME-A EXTRACTABLE NUCLEAR ANTIBODY: SSA (Ro) (ENA) Antibody, IgG: <1 AI

## 2024-07-05 LAB — SJOGRENS SYNDROME-B EXTRACTABLE NUCLEAR ANTIBODY: SSB (La) (ENA) Antibody, IgG: <1 AI

## 2024-07-11 ENCOUNTER — Ambulatory Visit: Payer: Self-pay | Admitting: Family Medicine

## 2024-08-01 ENCOUNTER — Encounter: Payer: Self-pay | Admitting: Family Medicine

## 2024-08-01 ENCOUNTER — Ambulatory Visit (INDEPENDENT_AMBULATORY_CARE_PROVIDER_SITE_OTHER): Admitting: Family Medicine

## 2024-08-01 VITALS — BP 110/74 | HR 60 | Temp 98.3°F | Ht 64.0 in | Wt 174.4 lb

## 2024-08-01 DIAGNOSIS — N951 Menopausal and female climacteric states: Secondary | ICD-10-CM

## 2024-08-01 DIAGNOSIS — E782 Mixed hyperlipidemia: Secondary | ICD-10-CM | POA: Diagnosis not present

## 2024-08-01 DIAGNOSIS — R7309 Other abnormal glucose: Secondary | ICD-10-CM | POA: Diagnosis not present

## 2024-08-01 LAB — POCT GLYCOSYLATED HEMOGLOBIN (HGB A1C): Hemoglobin A1C: 5.6 % (ref 4.0–5.6)

## 2024-08-01 MED ORDER — ATORVASTATIN CALCIUM 20 MG PO TABS: 20.0000 mg | ORAL_TABLET | Freq: Every day | ORAL | 0 refills | Status: AC

## 2024-08-01 NOTE — Progress Notes (Signed)
 Established Patient Office Visit   Subjective  Patient ID: Rebecca Lindsey, female    DOB: [birth year]  Age: 58 y.o. MRN: [id number]  Chief Complaint  Patient presents with   Annual Exam    Patient is a 58 year old female seen for follow-up A1c and lipids.  Patient seen on 07/04/2024 for CPE.  During which time A1c was elevated at 6.5%.  Patient eats healthy overall and exercises regularly.  Trying to figure out what could have caused increased A1c.  Patient started taking OTC fish oil tablets.  Not currently on Lipitor 20 but is taking in the past.  Okay with having to restart medication if needed.  Patient notes family history of HLD and DM.  Patient with continued hot flashes.  States may have improved some/are tolerable.  Taking OTC black girl vitamins for menopause x 1 mo.  In the past tried OTC black coash and estroven.  Also used the patch which was helpful.  Thinks stopped d/t hearing the possible s/es.    Patient Active Problem List   Diagnosis Date Noted   Mixed hyperlipidemia 04/29/2023   Hot flashes due to menopause 04/29/2023   Fibroids, intramural 08/21/2015   Menorrhagia with irregular cycle    Fibroid, uterine    Past Medical History:  Diagnosis Date   Anemia    Fibroid    Past Surgical History:  Procedure Laterality Date   +-     CESAREAN SECTION     MYOMECTOMY  2004   TUBAL LIGATION     Social History   Tobacco Use   Smoking status: Never   Smokeless tobacco: Never  Substance Use Topics   Alcohol use: Yes    Alcohol/week: 0.0 standard drinks of alcohol    Comment: occasional glass of wine     Drug use: No   Family History  Problem Relation Age of Onset   Breast cancer Neg Hx    No Known Allergies  ROS Negative unless stated above    Objective:     BP 110/74 (BP Location: Left Arm, Patient Position: Sitting, Cuff Size: Large)   Pulse 60   Temp 98.3 F (36.8 C) (Oral)   Ht 5' 4 (1.626 m)   Wt 174 lb 6.4 oz (79.1 kg)   LMP 08/13/2015  (Approximate)   BMI 29.94 kg/m  BP Readings from Last 3 Encounters:  08/01/24 110/74  07/04/24 108/64  10/07/23 100/74   Wt Readings from Last 3 Encounters:  08/01/24 174 lb 6.4 oz (79.1 kg)  07/04/24 174 lb 6.4 oz (79.1 kg)  10/07/23 177 lb (80.3 kg)      Physical Exam Constitutional:      Appearance: Normal appearance.  HENT:     Head: Normocephalic and atraumatic.     Nose: Nose normal.     Mouth/Throat:     Mouth: Mucous membranes are moist.  Cardiovascular:     Rate and Rhythm: Normal rate.  Pulmonary:     Effort: Pulmonary effort is normal.  Skin:    General: Skin is warm and dry.  Neurological:     Mental Status: She is alert and oriented to person, place, and time. Mental status is at baseline.        07/04/2024   11:57 AM 10/07/2023    4:47 PM 07/30/2023    9:55 AM  Depression screen PHQ 2/9  Decreased Interest 1 0 0  Down, Depressed, Hopeless 0 0 0  PHQ - 2 Score 1 0 0  Altered sleeping 0 0 0  Tired, decreased energy 1 0 1  Change in appetite 0 0 0  Feeling bad or failure about yourself  0 0 0  Trouble concentrating 0 0 0  Moving slowly or fidgety/restless 0 0 0  Suicidal thoughts 0 0 0  PHQ-9 Score 2 0 1  Difficult doing work/chores Not difficult at all Not difficult at all Not difficult at all      07/04/2024   11:57 AM 10/07/2023    4:47 PM 07/30/2023    9:55 AM 04/29/2023    9:59 AM  GAD 7 : Generalized Anxiety Score  Nervous, Anxious, on Edge 0 0 0 0  Control/stop worrying 0 0 0 0  Worry too much - different things 0 0 0 0  Trouble relaxing 0 0 0 0  Restless 0 0 0 0  Easily annoyed or irritable 1 0 0 1  Afraid - awful might happen 0 0 0 0  Total GAD 7 Score 1 0 0 1  Anxiety Difficulty Not difficult at all Not difficult at all Not difficult at all Not difficult at all     Results for orders placed or performed in visit on 08/01/24  POC HgB A1c  Result Value Ref Range   Hemoglobin A1C 5.6 4.0 - 5.6 %   HbA1c POC (<> result, manual  entry)     HbA1c, POC (prediabetic range)     HbA1c, POC (controlled diabetic range)        Assessment & Plan:   Mixed hyperlipidemia -     Atorvastatin Calcium; Take 1 tablet (20 mg total) by mouth daily.  Dispense: 90 tablet; Refill: 0 -     Lipid panel; Future -     Lipoprotein A (LPA); Future  Elevated hemoglobin A1c -     POCT glycosylated hemoglobin (Hb A1C)  Hot flashes due to menopause   Total cholesterol 259, LDL 173, triglycerides 113, HDL 63.21 07/04/2024 at CPE.  Patient will continue taking fish oil tablets for the next few months then have cholesterol rechecked.  For continued cholesterol elevation restart statin.  Refill sent to pharmacy in case chooses to start earlier.  Continue lifestyle modifications.  Also obtain lipoprotein a at next cholesterol check.  A1c elevated during time of CPAP at 6.5% on 07/04/2024.  Repeat in clinic 5.6%.  Per further review at time of visit patient was experiencing throat irritation.  Elevation in A1c may have been from increased use of lozenges, cough drops, mints.  Continue lifestyle modifications.  Continue to monitor.  Patient will continue OTC BG vitamins for menopause.  If no improvement or increased symptoms consider prescription medication options.  Reviewed r/b/a of prescription medication options.   Return in about 3 months (around 11/01/2024).   Clotilda JONELLE Single, MD

## 2024-08-20 ENCOUNTER — Ambulatory Visit
Admission: RE | Admit: 2024-08-20 | Discharge: 2024-08-20 | Disposition: A | Source: Ambulatory Visit | Attending: Family Medicine

## 2024-08-20 ENCOUNTER — Other Ambulatory Visit: Payer: Self-pay | Admitting: Family Medicine

## 2024-08-20 DIAGNOSIS — Z1231 Encounter for screening mammogram for malignant neoplasm of breast: Secondary | ICD-10-CM
